# Patient Record
Sex: Female | Born: 1980 | Hispanic: Yes | Smoking: Never smoker
Health system: Southern US, Community
[De-identification: ages and names within clinical notes are randomized; demographics above are authoritative.]

## PROBLEM LIST (undated history)

## (undated) DIAGNOSIS — N83209 Unspecified ovarian cyst, unspecified side: Secondary | ICD-10-CM

## (undated) DIAGNOSIS — J45909 Unspecified asthma, uncomplicated: Secondary | ICD-10-CM

## (undated) DIAGNOSIS — F419 Anxiety disorder, unspecified: Secondary | ICD-10-CM

## (undated) DIAGNOSIS — N939 Abnormal uterine and vaginal bleeding, unspecified: Secondary | ICD-10-CM

## (undated) DIAGNOSIS — Z803 Family history of malignant neoplasm of breast: Secondary | ICD-10-CM

## (undated) DIAGNOSIS — R87619 Unspecified abnormal cytological findings in specimens from cervix uteri: Secondary | ICD-10-CM

## (undated) DIAGNOSIS — F32A Depression, unspecified: Secondary | ICD-10-CM

## (undated) DIAGNOSIS — D649 Anemia, unspecified: Secondary | ICD-10-CM

## (undated) DIAGNOSIS — Z789 Other specified health status: Secondary | ICD-10-CM

## (undated) DIAGNOSIS — G43009 Migraine without aura, not intractable, without status migrainosus: Secondary | ICD-10-CM

## (undated) DIAGNOSIS — F329 Major depressive disorder, single episode, unspecified: Secondary | ICD-10-CM

## (undated) HISTORY — DX: Family history of malignant neoplasm of breast: Z80.3

## (undated) HISTORY — PX: COLPOSCOPY: SHX161

## (undated) HISTORY — DX: Abnormal uterine and vaginal bleeding, unspecified: N93.9

## (undated) HISTORY — DX: Anemia, unspecified: D64.9

## (undated) HISTORY — DX: Depression, unspecified: F32.A

## (undated) HISTORY — DX: Anxiety disorder, unspecified: F41.9

## (undated) HISTORY — DX: Unspecified abnormal cytological findings in specimens from cervix uteri: R87.619

## (undated) HISTORY — DX: Migraine without aura, not intractable, without status migrainosus: G43.009

## (undated) HISTORY — DX: Major depressive disorder, single episode, unspecified: F32.9

## (undated) HISTORY — DX: Unspecified asthma, uncomplicated: J45.909

## (undated) HISTORY — DX: Unspecified ovarian cyst, unspecified side: N83.209

## (undated) HISTORY — PX: TUBAL LIGATION: SHX77

---

## 2017-04-20 ENCOUNTER — Encounter (HOSPITAL_COMMUNITY): Payer: Self-pay | Admitting: Emergency Medicine

## 2017-04-20 ENCOUNTER — Emergency Department (HOSPITAL_COMMUNITY)
Admission: EM | Admit: 2017-04-20 | Discharge: 2017-04-20 | Disposition: A | Discharge status: Home / Self Care | Payer: Medicaid Other | Attending: Emergency Medicine | Admitting: Emergency Medicine

## 2017-04-20 DIAGNOSIS — N939 Abnormal uterine and vaginal bleeding, unspecified: Secondary | ICD-10-CM | POA: Insufficient documentation

## 2017-04-20 LAB — HEMOGLOBIN AND HEMATOCRIT, BLOOD
HEMATOCRIT: 32.7 % — AB (ref 36.0–46.0)
HEMOGLOBIN: 9.4 g/dL — AB (ref 12.0–15.0)

## 2017-04-20 LAB — I-STAT BETA HCG BLOOD, ED (MC, WL, AP ONLY): I-stat hCG, quantitative: <5 m[IU]/mL (ref <5)

## 2017-04-20 MED ORDER — MEDROXYPROGESTERONE ACETATE 5 MG PO TABS: 10.0000 mg | ORAL_TABLET | Freq: Every day | ORAL | 0 refills | Status: DC

## 2017-04-20 NOTE — ED Provider Notes (Signed)
[facility] DEPT Provider Note   CSN: [id number] Arrival date & time: 04/20/17  1322     History   Chief Complaint Chief Complaint  Patient presents with  . Vaginal Bleeding    HPI Terri Walker is a 36 y.o. female PMHx of anemia presents today complaining of abdominal pain that started last night and vaginal bleeding that began today this afternoon. She reports her abdominal pain as unchanged since, constant, gripping pain is moderate to severe in severity. She any alleviating or aggravating factors. She denies trying anything for her pain. She also reports vaginal bleeding that has been constant, unchanged, and bright red blood. She reports going through 2 pads since this afternoon. She states this is different from her usual menstrual cycle and does not think this is part of it. She states her LMP was 2 months and 6 days ago. LMP On 02/12/2017. She denies any fever, chills, chest pain, shortness of breath, nausea, vomiting, diarrhea. She denies any vaginal discharge, vaginal pain. She denies hx of STD.   The history is provided by the patient. No language interpreter was used.  Vaginal Bleeding  Primary symptoms include vaginal bleeding. Associated symptoms include abdominal pain. Pertinent negatives include no diarrhea, no nausea and no vomiting.    History reviewed. No pertinent past medical history.  There are no active problems to display for this patient.   History reviewed. No pertinent surgical history.  OB History    No data available       Home Medications    Prior to Admission medications   Medication Sig Start Date End Date Taking? Authorizing Provider  medroxyPROGESTERone (PROVERA) 5 MG tablet Take 2 tablets (10 mg total) by mouth daily. 04/20/17 04/25/17  Bettey Costa, PA    Family History No family history on file.  Social History Social History  Substance Use Topics  . Smoking status: Not on file  . Smokeless tobacco: Not on file  .  Alcohol use No     Allergies   Patient has no allergy information on record.   Review of Systems Review of Systems  Constitutional: Negative for chills and fever.  Gastrointestinal: Positive for abdominal pain. Negative for diarrhea, nausea and vomiting.  Genitourinary: Positive for vaginal bleeding.  All other systems reviewed and are negative.    Physical Exam Updated Vital Signs BP (!) 176/85 (BP Location: Left Arm)   Pulse (!) 104   Temp 98.9 F (37.2 C) (Oral)   Resp 16   LMP 02/12/2017   SpO2 100%   Physical Exam  Constitutional: She appears well-developed and well-nourished.  Well appearing  HENT:  Head: Normocephalic and atraumatic.  Nose: Nose normal.  Eyes: Conjunctivae and EOM are normal.  Neck: Normal range of motion.  Cardiovascular: Normal rate, normal heart sounds and intact distal pulses.   No murmur heard. Pulmonary/Chest: Effort normal and breath sounds normal. No respiratory distress. She has no wheezes. She has no rales.  Normal work of breathing. No respiratory distress noted.   Abdominal: Soft. Bowel sounds are normal. There is tenderness (Lower abdomen). There is no rebound.  Genitourinary:  Genitourinary Comments: Exam performed by Bettey Costa,  exam chaperoned Date: 04/20/2017 Pelvic exam: normal external genitalia without evidence of trauma. VULVA: normal appearing vulva with no masses, tenderness or lesion. Dark blood noted. VAGINA: mild to moderate about of dark blood with small amount of bleeding pooling during exam. Small amount of blood clots noted. No lesions noted. CERVIX: normal appearing cervix  without lesions, cervical motion tenderness absent, cervical os closed with out purulent discharge; vaginal discharge - bloody,    ADNEXA: normal adnexa in size, mild tenderness to right side, no TTP to left and no masses UTERUS: uterus is normal size, shape, consistency and nontender.   Musculoskeletal: Normal range of motion.    Neurological: She is alert.  Skin: Skin is warm. Capillary refill takes less than 2 seconds.  Psychiatric: She has a normal mood and affect. Her behavior is normal.  Nursing note and vitals reviewed.    ED Treatments / Results  Labs (all labs ordered are listed, but only abnormal results are displayed) Labs Reviewed  HEMOGLOBIN AND HEMATOCRIT, BLOOD - Abnormal; Notable for the following:       Result Value   Hemoglobin 9.4 (*)    HCT 32.7 (*)    All other components within normal limits  I-STAT BETA HCG BLOOD, ED ([facility], [facility], [facility] ONLY)  SAMPLE TO BLOOD BANK    EKG  EKG Interpretation None       Radiology No results found.  Procedures Procedures (including critical care time)  Medications Ordered in ED Medications - No data to display   Initial Impression / Assessment and Plan / ED Course  I have reviewed the triage vital signs and the nursing notes.  Pertinent labs & imaging results that were available during my care of the patient were reviewed by me and considered in my medical decision making (see chart for details).    Patient here with  dysmenorrhea. On exam patient is afebrile, hemodynamically stable, in no apparent distress. Heart and lung sounds are clear. Patient had mild tenderness to lower abdomen/ suprapubic area. Pelvic exam showed minimal active bleeding, dark in nature. Pregnancy test negative. Hbg 9.4. She reports a history of anemia. I discussed pt case with Dr. Tamera Punt who was not very concerned about pt's presentation due to her not having a period in over 2 months.  Patient given instruction to follow up with [facility] for any specific new or worsening symptoms where they have an emergency department for her specialized care. Patient also given instruction to follow up outpatient with [facility] regarding today's visit to see a GYN. Patient started on Provera. Patient given instruction to start Provera in 2-3 days if  symptoms have not improved. I feel safe for discharge at this time. Patient verbally understands and is in agreement with assessment and plan. Case discussed with Dr. Tamera Punt who is in agreement with assessment and plan.     Final Clinical Impressions(s) / ED Diagnoses   Final diagnoses:  Vaginal bleeding    New Prescriptions Discharge Medication List as of 04/20/2017  8:07 PM    START taking these medications   Details  medroxyPROGESTERone (PROVERA) 5 MG tablet Take 2 tablets (10 mg total) by mouth daily., Starting Mon 04/20/2017, Until Sat 04/25/2017, Print         Leandrew Koyanagi [place], [place] 04/20/17 2350    Malvin Johns, MD 04/21/17 [phone]

## 2017-04-20 NOTE — Discharge Instructions (Signed)
Please follow up with [facility] which also has an emergency Department. Please go there for any new or worsening symptoms. You can also schedule appointment for outpatient visit to the same [place] help right away if: You pass out. You are changing pads every 15 to 30 minutes. You have abdominal pain. You have a fever. You become sweaty or weak. You are passing large blood clots from the vagina. You start to feel nauseous and vomit.

## 2017-04-20 NOTE — ED Triage Notes (Signed)
Per patient, states she hasn't had a period for over 2 months-states she stood up and started to bleed-having lower abdominal pain-states urine pregnancy test was negative-states with all her pregnancies her tests were negative

## 2017-04-21 LAB — SAMPLE TO BLOOD BANK

## 2018-04-22 ENCOUNTER — Other Ambulatory Visit: Payer: Self-pay

## 2018-04-22 ENCOUNTER — Inpatient Hospital Stay (HOSPITAL_COMMUNITY)
Admission: AD | Admit: 2018-04-22 | Discharge: 2018-04-23 | Disposition: A | Discharge status: Home / Self Care | Payer: Medicaid Other | Source: Ambulatory Visit | Attending: Obstetrics and Gynecology | Admitting: Obstetrics and Gynecology

## 2018-04-22 ENCOUNTER — Encounter (HOSPITAL_COMMUNITY): Payer: Self-pay | Admitting: *Deleted

## 2018-04-22 DIAGNOSIS — Z885 Allergy status to narcotic agent status: Secondary | ICD-10-CM | POA: Insufficient documentation

## 2018-04-22 DIAGNOSIS — N920 Excessive and frequent menstruation with regular cycle: Secondary | ICD-10-CM | POA: Insufficient documentation

## 2018-04-22 DIAGNOSIS — D500 Iron deficiency anemia secondary to blood loss (chronic): Secondary | ICD-10-CM | POA: Diagnosis not present

## 2018-04-22 DIAGNOSIS — R110 Nausea: Secondary | ICD-10-CM | POA: Insufficient documentation

## 2018-04-22 DIAGNOSIS — N939 Abnormal uterine and vaginal bleeding, unspecified: Secondary | ICD-10-CM

## 2018-04-22 HISTORY — DX: Other specified health status: Z78.9

## 2018-04-22 LAB — URINALYSIS, ROUTINE W REFLEX MICROSCOPIC
Bilirubin Urine: NEGATIVE
GLUCOSE, UA: NEGATIVE mg/dL
Ketones, ur: NEGATIVE mg/dL
Leukocytes, UA: NEGATIVE
Nitrite: NEGATIVE
PROTEIN: 30 mg/dL — AB
RBC / HPF: >50 RBC/hpf — ABNORMAL HIGH (ref 0–5)
Specific Gravity, Urine: 1.021 (ref 1.005–1.030)
pH: 6 (ref 5.0–8.0)

## 2018-04-22 LAB — CBC
HCT: 26.6 % — ABNORMAL LOW (ref 36.0–46.0)
Hemoglobin: 7.3 g/dL — ABNORMAL LOW (ref 12.0–15.0)
MCH: 17.5 pg — ABNORMAL LOW (ref 26.0–34.0)
MCHC: 27.4 g/dL — ABNORMAL LOW (ref 30.0–36.0)
MCV: 63.6 fL — ABNORMAL LOW (ref 78.0–100.0)
Platelets: 369 10*3/uL (ref 150–400)
RBC: 4.18 MIL/uL (ref 3.87–5.11)
RDW: 20.7 % — ABNORMAL HIGH (ref 11.5–15.5)
WBC: 9.3 10*3/uL (ref 4.0–10.5)

## 2018-04-22 LAB — HCG, SERUM, QUALITATIVE: PREG SERUM: NEGATIVE

## 2018-04-22 LAB — POCT PREGNANCY, URINE: PREG TEST UR: NEGATIVE

## 2018-04-22 NOTE — MAU Provider Note (Signed)
Chief Complaint:  Nausea and Vaginal Bleeding   First Provider Initiated Contact with Patient 04/22/18 2341      HPI: Terri Walker is a 37 y.o. [id number] who presents to maternity admissions reporting heavy vaginal bleeding since May 1st.  Also has cramping, mostly on left side. Has had dizziness but not much now. . She reports vaginal bleeding, vaginal itching/burning, urinary symptoms, h/a, dizziness, n/v, or fever/chills.    Vaginal Bleeding  The patient's primary symptoms include pelvic pain and vaginal bleeding. The patient's pertinent negatives include no genital itching, genital lesions or genital odor. This is a recurrent problem. The current episode started 1 to 4 weeks ago. The problem occurs constantly. The problem has been unchanged. She is not pregnant. Associated symptoms include abdominal pain. Pertinent negatives include no constipation, diarrhea, dysuria, fever, frequency or headaches. The vaginal discharge was bloody. The vaginal bleeding is heavier than menses. She has been passing clots. She has not been passing tissue. Nothing aggravates the symptoms. She has tried nothing for the symptoms. Her menstrual history has been regular (except for this cycle).   RN note: Pt presents to MAU c/o vaginal bleeding and clotting. Pt states on March 22nd was her last normal LMP and she states no period in the month of April. Pt reports that on May 1st she started to have heavy vaginal bleeding and abdominal cramping. Pt states she she is passing clots the size of a small half dollar size coin to a small tangerine. Pt reports feeling dizzy. Pt reports the pain is on her left side of her abdomen and to her back.     Past Medical History: Past Medical History:  Diagnosis Date  . Medical history non-contributory     Past obstetric history: OB History  Gravida Para Term Preterm AB Living  5 4 4   1 4   SAB TAB Ectopic Multiple Live Births  1       4    # Outcome Date GA Lbr Len/2nd  Weight Sex Delivery Anes PTL Lv  5 SAB           4 Term           3 Term           2 Term           1 Term             Past Surgical History: Past Surgical History:  Procedure Laterality Date  . CESAREAN SECTION    . CESAREAN SECTION WITH BILATERAL TUBAL LIGATION      Family History: Family History  Problem Relation Age of Onset  . Cancer Mother   . Diabetes Father   . ADD / ADHD Neg Hx   . Alcohol abuse Neg Hx   . Anxiety disorder Neg Hx   . Arthritis Neg Hx   . Asthma Neg Hx   . Birth defects Neg Hx   . COPD Neg Hx   . Depression Neg Hx   . Drug abuse Neg Hx   . Early death Neg Hx   . Hearing loss Neg Hx   . Heart disease Neg Hx   . Hypertension Neg Hx   . Hyperlipidemia Neg Hx   . Intellectual disability Neg Hx   . Learning disabilities Neg Hx   . Kidney disease Neg Hx   . Miscarriages / Stillbirths Neg Hx   . Obesity Neg Hx   . Stroke Neg Hx   . Vision loss  Neg Hx   . Varicose Veins Neg Hx     Social History: Social History   Tobacco Use  . Smoking status: Never Smoker  . Smokeless tobacco: Never Used  Substance Use Topics  . Alcohol use: No  . Drug use: No    Allergies:  Allergies  Allergen Reactions  . Codeine Shortness Of Breath    Meds:  Medications Prior to Admission  Medication Sig Dispense Refill Last Dose  . medroxyPROGESTERone (PROVERA) 5 MG tablet Take 2 tablets (10 mg total) by mouth daily. 10 tablet 0     I have reviewed patient's Past Medical Hx, Surgical Hx, Family Hx, Social Hx, medications and allergies.  ROS:  Review of Systems  Constitutional: Negative for fever.  Gastrointestinal: Positive for abdominal pain. Negative for constipation and diarrhea.  Genitourinary: Positive for pelvic pain and vaginal bleeding. Negative for dysuria and frequency.  Neurological: Negative for headaches.   Other systems negative     Physical Exam   Patient Vitals for the past 24 hrs:  BP Temp Temp src Resp Height Weight  04/22/18  1938 (!) 161/92 98.8 F (37.1 C) Oral 17 5\' 5"  (1.651 m) (!) 306 lb 1.9 oz (138.9 kg)   Constitutional: Well-developed, well-nourished female in no acute distress.  Cardiovascular: normal rate and rhythm, no ectopy audible, S1 & S2 heard, no murmur Respiratory: normal effort, no distress. Lungs CTAB with no wheezes or crackles GI: Abd soft, non-tender.  Nondistended.  No rebound, No guarding.  Bowel Sounds audible  MS: Extremities nontender, no edema, normal ROM Neurologic: Alert and oriented x 4.   Grossly nonfocal. GU: Neg CVAT. Skin:  Warm and Dry Psych:  Affect appropriate.  PELVIC EXAM: Cervix pink, visually closed, without lesion, small  Amount of bloody discharge, vaginal walls and external genitalia normal Bimanual exam: Cervix firm, anterior, neg CMT, uterus nontender, nonenlarged, adnexa without tenderness, enlargement, or mass    Labs: Results for orders placed or performed during the hospital encounter of 04/22/18 (from the past 24 hour(s))  Urinalysis, Routine w reflex microscopic     Status: Abnormal   Collection Time: 04/22/18  7:37 PM  Result Value Ref Range   Color, Urine YELLOW YELLOW   APPearance HAZY (A) CLEAR   Specific Gravity, Urine 1.021 1.005 - 1.030   pH 6.0 5.0 - 8.0   Glucose, UA NEGATIVE NEGATIVE mg/dL   Hgb urine dipstick LARGE (A) NEGATIVE   Bilirubin Urine NEGATIVE NEGATIVE   Ketones, ur NEGATIVE NEGATIVE mg/dL   Protein, ur 30 (A) NEGATIVE mg/dL   Nitrite NEGATIVE NEGATIVE   Leukocytes, UA NEGATIVE NEGATIVE   RBC / HPF >50 (H) 0 - 5 RBC/hpf   WBC, UA 11-20 0 - 5 WBC/hpf   Bacteria, UA RARE (A) NONE SEEN   Squamous Epithelial / LPF 6-10 0 - 5   Mucus PRESENT   Pregnancy, urine POC     Status: None   Collection Time: 04/22/18  8:17 PM  Result Value Ref Range   Preg Test, Ur NEGATIVE NEGATIVE  CBC     Status: Abnormal   Collection Time: 04/22/18 10:06 PM  Result Value Ref Range   WBC 9.3 4.0 - 10.5 K/uL   RBC 4.18 3.87 - 5.11 MIL/uL    Hemoglobin 7.3 (L) 12.0 - 15.0 g/dL   HCT 26.6 (L) 36.0 - 46.0 %   MCV 63.6 (L) 78.0 - 100.0 fL   MCH 17.5 (L) 26.0 - 34.0 pg   MCHC 27.4 (L)  30.0 - 36.0 g/dL   RDW 20.7 (H) 11.5 - 15.5 %   Platelets 369 150 - 400 K/uL  hCG, serum, qualitative     Status: None   Collection Time: 04/22/18 10:06 PM  Result Value Ref Range   Preg, Serum NEGATIVE NEGATIVE       Ref. Range 04/20/2017 15:07  Hemoglobin Latest Ref Range: 12.0 - 15.0 g/dL 9.4 (L)    Imaging:  No results found.  MAU Course/MDM: I have ordered labs as follows:  See above. Pt insists UPTs are not accurate for her so qualitative done Imaging ordered: Outpatient [place] Results reviewed. Has anemia but is hemodynamically stable  Consult Dr Elly Modena.  Recommends Fereheme and Megace for discharge   Will follow in clinic.   Treatments in MAU included Fereheme.  States feels better after infusion Pt stable at time of discharge.  Assessment: Abnormal Uterine Bleeding Anemia Due to Blood Loss  Plan: Discharge home Recommend followup in office (will have office schedule) Rx sent for megace for menorrhagia Outpatient [place] ordered Bleeding precautions  Encouraged to return here or to other Urgent Care/ED if she develops worsening of symptoms, increase in pain, fever, or other concerning symptoms.   Hansel Feinstein CNM, MSN Certified Nurse-Midwife 04/22/2018 11:42 PM

## 2018-04-22 NOTE — MAU Note (Signed)
Pt presents to MAU c/o vaginal bleeding and clotting. Pt states on March 22nd was her last normal LMP and she states no period in the month of April. Pt reports that on May 1st she started to have heavy vaginal bleeding and abdominal cramping. Pt states she she is passing clots the size of a small half dollar size coin to a small tangerine. Pt reports feeling dizzy. Pt reports the pain is on her left side of her abdomen and to her back.

## 2018-04-23 DIAGNOSIS — N939 Abnormal uterine and vaginal bleeding, unspecified: Secondary | ICD-10-CM

## 2018-04-23 MED ORDER — FERUMOXYTOL INJECTION 510 MG/17 ML
510.0000 mg | Freq: Once | INTRAVENOUS | Status: AC
  Administered 2018-04-23: 510 mg via INTRAVENOUS
  Filled 2018-04-23: qty 17

## 2018-04-23 MED ORDER — SODIUM CHLORIDE 0.9 % IV SOLN
INTRAVENOUS | Status: DC
  Administered 2018-04-23: 01:00:00 via INTRAVENOUS

## 2018-04-23 MED ORDER — MEGESTROL ACETATE 40 MG PO TABS: 40.0000 mg | ORAL_TABLET | Freq: Two times a day (BID) | ORAL | 5 refills | Status: DC

## 2018-04-23 MED ORDER — FERROUS FUMARATE-FOLIC ACID 324-1 MG PO TABS: 1.0000 | ORAL_TABLET | Freq: Two times a day (BID) | ORAL | 1 refills | Status: DC

## 2018-04-23 NOTE — Discharge Instructions (Signed)
° °Abnormal Uterine Bleeding °Abnormal uterine bleeding can affect women at various stages in life, including teenagers, women in their reproductive years, pregnant women, and women who have reached menopause. Several kinds of uterine bleeding are considered abnormal, including: °· Bleeding or spotting between periods. °· Bleeding after sexual intercourse. °· Bleeding that is heavier or more than normal. °· Periods that last longer than usual. °· Bleeding after menopause. ° °Many cases of abnormal uterine bleeding are minor and simple to treat, while others are more serious. Any type of abnormal bleeding should be evaluated by your health care provider. Treatment will depend on the cause of the bleeding. °Follow these instructions at home: °Monitor your condition for any changes. The following actions may help to alleviate any discomfort you are experiencing: °· Avoid the use of tampons and douches as directed by your health care provider. °· Change your pads frequently. ° °You should get regular pelvic exams and Pap tests. Keep all follow-up appointments for diagnostic tests as directed by your health care provider. °Contact a health care provider if: °· Your bleeding lasts more than 1 week. °· You feel dizzy at times. °Get help right away if: °· You pass out. °· You are changing pads every 15 to 30 minutes. °· You have abdominal pain. °· You have a fever. °· You become sweaty or weak. °· You are passing large blood clots from the vagina. °· You start to feel nauseous and vomit. °This information is not intended to replace advice given to you by your health care provider. Make sure you discuss any questions you have with your health care provider. °Document Released: 11/24/2005 Document Revised: 05/07/2016 Document Reviewed: 06/23/2013 °Elsevier Interactive Patient Education © 2017 [employer] ° ° °Anemia °Anemia is a condition in which you do not have enough red blood cells or hemoglobin. Hemoglobin is a  substance in red blood cells that carries oxygen. When you do not have enough red blood cells or hemoglobin (are anemic), your body cannot get enough oxygen and your organs may not work properly. As a result, you may feel very tired or have other problems. °What are the causes? °Common causes of anemia include: °· Excessive bleeding. Anemia can be caused by excessive bleeding inside or outside the body, including bleeding from the intestine or from periods in women. °· Poor nutrition. °· Long-lasting (chronic) kidney, thyroid, and liver disease. °· Bone marrow disorders. °· Cancer and treatments for cancer. °· HIV (human immunodeficiency virus) and AIDS (acquired immunodeficiency syndrome). °· Treatments for HIV and AIDS. °· Spleen problems. °· Blood disorders. °· Infections, medicines, and autoimmune disorders that destroy red blood cells. ° °What are the signs or symptoms? °Symptoms of this condition include: °· Minor weakness. °· Dizziness. °· Headache. °· Feeling heartbeats that are irregular or faster than normal (palpitations). °· Shortness of breath, especially with exercise. °· Paleness. °· Cold sensitivity. °· Indigestion. °· Nausea. °· Difficulty sleeping. °· Difficulty concentrating. ° °Symptoms may occur suddenly or develop slowly. If your anemia is mild, you may not have symptoms. °How is this diagnosed? °This condition is diagnosed based on: °· Blood tests. °· Your medical history. °· A physical exam. °· Bone marrow biopsy. ° °Your health care provider may also check your stool (feces) for blood and may do additional testing to look for the cause of your bleeding. °You may also have other tests, including: °· Imaging tests, such as a CT scan or MRI. °· Endoscopy. °· Colonoscopy. ° °How is this treated? °  this condition depends on the cause. If you continue to lose a lot of blood, you may need to be treated at a hospital. Treatment may include:  Taking supplements of iron, vitamin U27, or folic  acid.  Taking a hormone medicine (erythropoietin) that can help to stimulate red blood cell growth.  Having a blood transfusion. This may be needed if you lose a lot of blood.  Making changes to your diet.  Having surgery to remove your spleen.  Follow these instructions at home:  Take over-the-counter and prescription medicines only as told by your health care provider.  Take supplements only as told by your health care provider.  Follow any diet instructions that you were given.  Keep all follow-up visits as told by your health care provider. This is important. Contact a health care provider if:  You develop new bleeding anywhere in the body. Get help right away if:  You are very weak.  You are short of breath.  You have pain in your abdomen or chest.  You are dizzy or feel faint.  You have trouble concentrating.  You have bloody or black, tarry stools.  You vomit repeatedly or you vomit up blood. Summary  Anemia is a condition in which you do not have enough red blood cells or enough of a substance in your red blood cells that carries oxygen (hemoglobin).  Symptoms may occur suddenly or develop slowly.  If your anemia is mild, you may not have symptoms.  This condition is diagnosed with blood tests as well as a medical history and physical exam. Other tests may be needed.  Treatment for this condition depends on the cause of the anemia. This information is not intended to replace advice given to you by your health care provider. Make sure you discuss any questions you have with your health care provider. Document Released: 01/01/2005 Document Revised: 12/26/2016 Document Reviewed: 12/26/2016 Elsevier Interactive Patient Education  [employer].

## 2018-04-29 ENCOUNTER — Ambulatory Visit (HOSPITAL_COMMUNITY): Payer: Medicaid Other

## 2018-04-30 ENCOUNTER — Ambulatory Visit (HOSPITAL_COMMUNITY): Payer: Medicaid Other | Attending: Advanced Practice Midwife

## 2018-05-21 ENCOUNTER — Encounter: Payer: Medicaid Other | Admitting: Obstetrics & Gynecology

## 2018-06-29 ENCOUNTER — Inpatient Hospital Stay (HOSPITAL_COMMUNITY)
Admission: AD | Admit: 2018-06-29 | Discharge: 2018-06-29 | Disposition: A | Discharge status: Home / Self Care | Payer: 59 | Source: Ambulatory Visit | Attending: Obstetrics and Gynecology | Admitting: Obstetrics and Gynecology

## 2018-06-29 ENCOUNTER — Encounter (HOSPITAL_COMMUNITY): Payer: Self-pay | Admitting: *Deleted

## 2018-06-29 ENCOUNTER — Other Ambulatory Visit: Payer: Self-pay

## 2018-06-29 DIAGNOSIS — R1032 Left lower quadrant pain: Secondary | ICD-10-CM | POA: Insufficient documentation

## 2018-06-29 DIAGNOSIS — Z9851 Tubal ligation status: Secondary | ICD-10-CM

## 2018-06-29 DIAGNOSIS — N921 Excessive and frequent menstruation with irregular cycle: Secondary | ICD-10-CM

## 2018-06-29 DIAGNOSIS — D649 Anemia, unspecified: Secondary | ICD-10-CM

## 2018-06-29 DIAGNOSIS — D508 Other iron deficiency anemias: Secondary | ICD-10-CM

## 2018-06-29 DIAGNOSIS — N939 Abnormal uterine and vaginal bleeding, unspecified: Secondary | ICD-10-CM | POA: Insufficient documentation

## 2018-06-29 LAB — URINALYSIS, ROUTINE W REFLEX MICROSCOPIC
BILIRUBIN URINE: NEGATIVE
GLUCOSE, UA: NEGATIVE mg/dL
KETONES UR: NEGATIVE mg/dL
Leukocytes, UA: NEGATIVE
NITRITE: NEGATIVE
Protein, ur: NEGATIVE mg/dL
Specific Gravity, Urine: 1.024 (ref 1.005–1.030)
pH: 6 (ref 5.0–8.0)

## 2018-06-29 LAB — WET PREP, GENITAL
Clue Cells Wet Prep HPF POC: NONE SEEN
Sperm: NONE SEEN
Trich, Wet Prep: NONE SEEN
Yeast Wet Prep HPF POC: NONE SEEN

## 2018-06-29 LAB — CBC
HCT: 32.1 % — ABNORMAL LOW (ref 36.0–46.0)
HEMOGLOBIN: 9.8 g/dL — AB (ref 12.0–15.0)
MCH: 22.2 pg — ABNORMAL LOW (ref 26.0–34.0)
MCHC: 30.5 g/dL (ref 30.0–36.0)
MCV: 72.6 fL — ABNORMAL LOW (ref 78.0–100.0)
Platelets: 299 10*3/uL (ref 150–400)
RBC: 4.42 MIL/uL (ref 3.87–5.11)
RDW: 24.3 % — ABNORMAL HIGH (ref 11.5–15.5)
WBC: 9.2 10*3/uL (ref 4.0–10.5)

## 2018-06-29 LAB — POCT PREGNANCY, URINE: Preg Test, Ur: NEGATIVE

## 2018-06-29 LAB — HCG, SERUM, QUALITATIVE: Preg, Serum: NEGATIVE

## 2018-06-29 MED ORDER — MEDROXYPROGESTERONE ACETATE 5 MG PO TABS: ORAL_TABLET | ORAL | 0 refills | Status: DC

## 2018-06-29 MED ORDER — ACETAMINOPHEN 325 MG PO TABS
650.0000 mg | ORAL_TABLET | Freq: Once | ORAL | Status: AC
  Administered 2018-06-29: 650 mg via ORAL
  Filled 2018-06-29: qty 2

## 2018-06-29 MED ORDER — NORGESTIMATE-ETH ESTRADIOL 0.25-35 MG-MCG PO TABS: 1.0000 | ORAL_TABLET | Freq: Every day | ORAL | 11 refills | Status: DC

## 2018-06-29 NOTE — MAU Provider Note (Signed)
History     CSN: [id number]  Arrival date and time: 06/29/18 4098   First Provider Initiated Contact with Patient 06/29/18 1002      Chief Complaint  Patient presents with  . Vaginal Bleeding   HPI  Terri Walker is a 37 y.o. [id number] NON-pregnant patient who presents to MAU with chief complaint of heavy vaginal bleeding and LLQ pain  Heavy vaginal bleeding This is an existing problem. States she had a period in April 2019, heavy bleeding in May, two weeks without bleeding after being treated in MAU, but has been saturating 3-4 pads per day since June 20.   LLQ Pain This is an existing problem. Patient reports "big pain" in LLQ. Unable to rate, "stabbing", does not radiate, no aggravating or alleviating factors. Did not attend outpatient ultrasound as ordered during May 2019 MAU visit. Reports she does not have cell phone service at work but does not know if she was ever called for ultrasound appointment.  Headache This is a new problem. Patient reports new onset headache without visual disturbances. Onset is recent, patient unable to specify date/circumstances. Pain is frontal "like an arrow through my forehead", 6-10/10. No aggravating or alleviating factors. Has not tried to treat with medicine.  Patient states she has not been taking her prescribed iron supplement. Reports intermittent lightheadedness, denies shortness of breath.  Patient has not established care for outpatient treatment.  Pertinent Gynecological History: Menses: Irregular, moderate to heavy bleeding Bleeding: dysfunctional uterine bleeding Contraception: tubal ligation DES exposure: unknown Blood transfusions: none Sexually transmitted diseases: no past history Previous GYN Procedures: N/A  Last mammogram: N/A age 31  Last pap: normal Date: unknown, states within past five years   Past Medical History:  Diagnosis Date  . Medical history non-contributory     Past Surgical History:  Procedure  Laterality Date  . CESAREAN SECTION    . CESAREAN SECTION WITH BILATERAL TUBAL LIGATION      Family History  Problem Relation Age of Onset  . Cancer Mother   . Diabetes Father   . ADD / ADHD Neg Hx   . Alcohol abuse Neg Hx   . Anxiety disorder Neg Hx   . Arthritis Neg Hx   . Asthma Neg Hx   . Birth defects Neg Hx   . COPD Neg Hx   . Depression Neg Hx   . Drug abuse Neg Hx   . Early death Neg Hx   . Hearing loss Neg Hx   . Heart disease Neg Hx   . Hypertension Neg Hx   . Hyperlipidemia Neg Hx   . Intellectual disability Neg Hx   . Learning disabilities Neg Hx   . Kidney disease Neg Hx   . Miscarriages / Stillbirths Neg Hx   . Obesity Neg Hx   . Stroke Neg Hx   . Vision loss Neg Hx   . Varicose Veins Neg Hx     Social History   Tobacco Use  . Smoking status: Never Smoker  . Smokeless tobacco: Never Used  Substance Use Topics  . Alcohol use: No  . Drug use: No    Allergies:  Allergies  Allergen Reactions  . Codeine Shortness Of Breath    Medications Prior to Admission  Medication Sig Dispense Refill Last Dose  . Ferrous Fumarate-Folic Acid (HEMOCYTE-F) 324-1 MG TABS Take 1 tablet by mouth every 12 (twelve) hours. 60 each 1   . megestrol (MEGACE) 40 MG tablet Take 1 tablet (40 mg total)  by mouth 2 (two) times daily. 60 tablet 5     Review of Systems  Constitutional: Negative for chills, fatigue and fever.  Respiratory: Negative for choking, chest tightness, shortness of breath and wheezing.   Cardiovascular: Negative for leg swelling.  Gastrointestinal:       LLQ pain  Genitourinary: Positive for vaginal bleeding. Negative for vaginal pain.  Neurological: Positive for light-headedness and headaches.  All other systems reviewed and are negative.  Physical Exam   Blood pressure (!) 157/95, pulse 87, temperature 98.5 F (36.9 C), temperature source Oral, resp. rate 18, weight 294 lb 12 oz (133.7 kg), last menstrual period 05/28/2018, SpO2 99 %.  Physical  Exam  Nursing note and vitals reviewed. Constitutional: She is oriented to person, place, and time. She appears well-developed and well-nourished.  Cardiovascular: Normal rate, regular rhythm, normal heart sounds and intact distal pulses.  GI: She exhibits no mass. There is tenderness. There is no rebound and no guarding.  LLQ tenderness to deep palpation  Genitourinary: Vagina normal and uterus normal.  Genitourinary Comments: Dark red blood visible on spec exam  Neurological: She is alert and oriented to person, place, and time. She has normal reflexes.  Skin: Skin is warm and dry.  Psychiatric: She has a normal mood and affect. Her behavior is normal. Judgment and thought content normal.    MAU Course  Procedures  MDM BMI 57, age 46, likely anovulatory Preexisting anemia, asymptomatic today Not consistent with recommended iron supplementation States Provera prescribed in 2018 was more effective than Megace prescribed in 2019 S/p BTL, not currently moderating bleeding with any medication other than Megace Headache alleviated by Tylenol given in MAU Patient extremely active and lively in MAU. Confirms health complaints are not impacting her lifestyle or level of activity  Patient Vitals for the past 24 hrs:  BP Temp Temp src Pulse Resp SpO2 Weight  06/29/18 1149 (!) 139/95 - - 85 - - -  06/29/18 0948 (!) 157/95 98.5 F (36.9 C) Oral 87 18 99 % 294 lb 12 oz (133.7 kg)    Orders Placed This Encounter  Procedures  . Wet prep, genital  . hCG, serum, qualitative  . CBC  . Urinalysis, Routine w reflex microscopic  . Pregnancy, urine POC  . Discharge patient   Results for orders placed or performed during the hospital encounter of 06/29/18 (from the past 24 hour(s))  hCG, serum, qualitative     Status: None   Collection Time: 06/29/18 10:11 AM  Result Value Ref Range   Preg, Serum NEGATIVE NEGATIVE  CBC     Status: Abnormal   Collection Time: 06/29/18 10:11 AM  Result Value  Ref Range   WBC 9.2 4.0 - 10.5 K/uL   RBC 4.42 3.87 - 5.11 MIL/uL   Hemoglobin 9.8 (L) 12.0 - 15.0 g/dL   HCT 32.1 (L) 36.0 - 46.0 %   MCV 72.6 (L) 78.0 - 100.0 fL   MCH 22.2 (L) 26.0 - 34.0 pg   MCHC 30.5 30.0 - 36.0 g/dL   RDW 24.3 (H) 11.5 - 15.5 %   Platelets 299 150 - 400 K/uL  Wet prep, genital     Status: Abnormal   Collection Time: 06/29/18 10:29 AM  Result Value Ref Range   Yeast Wet Prep HPF POC NONE SEEN NONE SEEN   Trich, Wet Prep NONE SEEN NONE SEEN   Clue Cells Wet Prep HPF POC NONE SEEN NONE SEEN   WBC, Wet Prep HPF POC FEW (  A) NONE SEEN   Sperm NONE SEEN   Urinalysis, Routine w reflex microscopic     Status: Abnormal   Collection Time: 06/29/18 11:13 AM  Result Value Ref Range   Color, Urine YELLOW YELLOW   APPearance HAZY (A) CLEAR   Specific Gravity, Urine 1.024 1.005 - 1.030   pH 6.0 5.0 - 8.0   Glucose, UA NEGATIVE NEGATIVE mg/dL   Hgb urine dipstick LARGE (A) NEGATIVE   Bilirubin Urine NEGATIVE NEGATIVE   Ketones, ur NEGATIVE NEGATIVE mg/dL   Protein, ur NEGATIVE NEGATIVE mg/dL   Nitrite NEGATIVE NEGATIVE   Leukocytes, UA NEGATIVE NEGATIVE   RBC / HPF 21-50 0 - 5 RBC/hpf   WBC, UA 6-10 0 - 5 WBC/hpf   Bacteria, UA RARE (A) NONE SEEN   Squamous Epithelial / LPF 6-10 0 - 5   Mucus PRESENT   Pregnancy, urine POC     Status: None   Collection Time: 06/29/18 11:16 AM  Result Value Ref Range   Preg Test, Ur NEGATIVE NEGATIVE   Meds ordered this encounter  Medications  . acetaminophen (TYLENOL) tablet 650 mg  . medroxyPROGESTERone (PROVERA) 5 MG tablet    Sig: Take 2 by mouth every day    Dispense:  30 tablet    Refill:  0    Order Specific Question:   Supervising Provider    Answer:   Donnamae Jude [[id number]]  . norgestimate-ethinyl estradiol (ORTHO-CYCLEN,SPRINTEC,PREVIFEM) 0.25-35 MG-MCG tablet    Sig: Take 1 tablet by mouth daily.    Dispense:  1 Package    Refill:  11    Order Specific Question:   Supervising Provider    Answer:   Donnamae Jude  [[id number]]   Assessment and Plan  --Irregular bleeding between periods --Discontinue Megace and begin short course of Provera while establishing GYN care --Patient declines outpatient pelvic ultrasound for LLQ pain  --Start COC to regulate bleeding, ACHES protocol reviewed --Restart iron supplementation as prescribed --General bleeding precautions reviewed --Discharge home in stable condition   Darlina Rumpf, CNM 06/29/2018, 11:58 AM

## 2018-06-29 NOTE — Discharge Instructions (Signed)

## 2018-06-29 NOTE — MAU Note (Signed)
Prolonged bleeding, bleeding started 32 days ago.  Got heavy last couple days, bleeding through pads and clothes, changing at least 3 an hour.

## 2018-06-30 LAB — GC/CHLAMYDIA PROBE AMP (~~LOC~~) NOT AT ARMC
Chlamydia: NEGATIVE
Neisseria Gonorrhea: NEGATIVE

## 2018-09-03 ENCOUNTER — Encounter: Payer: Self-pay | Admitting: Certified Nurse Midwife

## 2018-09-03 ENCOUNTER — Other Ambulatory Visit (HOSPITAL_COMMUNITY)
Admission: RE | Admit: 2018-09-03 | Discharge: 2018-09-03 | Disposition: A | Discharge status: Home / Self Care | Payer: 59 | Source: Ambulatory Visit | Attending: Obstetrics & Gynecology | Admitting: Obstetrics & Gynecology

## 2018-09-03 ENCOUNTER — Ambulatory Visit: Payer: 59 | Admitting: Certified Nurse Midwife

## 2018-09-03 VITALS — BP 128/82 | HR 70 | Resp 16 | Ht 64.25 in | Wt 304.0 lb

## 2018-09-03 DIAGNOSIS — N6320 Unspecified lump in the left breast, unspecified quadrant: Secondary | ICD-10-CM

## 2018-09-03 DIAGNOSIS — B9689 Other specified bacterial agents as the cause of diseases classified elsewhere: Secondary | ICD-10-CM | POA: Insufficient documentation

## 2018-09-03 DIAGNOSIS — Z113 Encounter for screening for infections with a predominantly sexual mode of transmission: Secondary | ICD-10-CM | POA: Insufficient documentation

## 2018-09-03 DIAGNOSIS — Z8742 Personal history of other diseases of the female genital tract: Secondary | ICD-10-CM

## 2018-09-03 DIAGNOSIS — Z01411 Encounter for gynecological examination (general) (routine) with abnormal findings: Secondary | ICD-10-CM

## 2018-09-03 DIAGNOSIS — N760 Acute vaginitis: Secondary | ICD-10-CM | POA: Diagnosis not present

## 2018-09-03 DIAGNOSIS — N912 Amenorrhea, unspecified: Secondary | ICD-10-CM

## 2018-09-03 DIAGNOSIS — E663 Overweight: Secondary | ICD-10-CM

## 2018-09-03 DIAGNOSIS — Z124 Encounter for screening for malignant neoplasm of cervix: Secondary | ICD-10-CM | POA: Insufficient documentation

## 2018-09-03 DIAGNOSIS — Z862 Personal history of diseases of the blood and blood-forming organs and certain disorders involving the immune mechanism: Secondary | ICD-10-CM | POA: Diagnosis not present

## 2018-09-03 DIAGNOSIS — Z8659 Personal history of other mental and behavioral disorders: Secondary | ICD-10-CM

## 2018-09-03 NOTE — Progress Notes (Signed)
37 y.o. [id number] Married  Hispanic Fe here to establish gyn care and for annual exam. Spouse with patient for support and patient agreeable to attendance and conversation.. Had last period 5/19 and bled per patient 50 days off and on heavy. Was seen in ER 06/29/18. for anemia and given iron infusion x 2 for fatigue and anemia. Surgery x 2  With C/section. History of abnormal pap smear with colposcopy. No pap follow since 2006. History migraines in past treats with Excedrin and coffee. Contraception BTL. Patient agreeable to labs and would like STD screening. Patient and spouse are amiable but not living together. Complaining of breast tenderness at times, but different. Family history of breast cancer mother age 54. Has not had mammogram screening yet. Continues with low pelvic pain on both sides off and on. Never severe. Previously on POP for cycle control at some point, but stopped. Tired of the heavy cycle occurrences. No other concerns today.         Sexually active: Yes.    The current method of family planning is tubal ligation.    Exercising: No.  exercise Smoker:  no  Review of Systems  Constitutional: Negative.   HENT: Negative.   Eyes: Negative.   Respiratory: Negative.   Cardiovascular: Negative.   Gastrointestinal: Negative.   Genitourinary:       Abnormal menstrual cycle  Musculoskeletal: Negative.   Skin:       Breast tenderness  Neurological: Negative.   Endo/Heme/Allergies: Negative.   Psychiatric/Behavioral: Negative.     Health Maintenance: Pap:  2006 History of Abnormal Pap: yes MMG:  none Self Breast exams: yes Colonoscopy:  none BMD:   none TDaP:  2014 Shingles: no Pneumonia: had done Hep C and HIV: both neg per patient Labs: if needed   reports that she has never smoked. She has never used smokeless tobacco. She reports that she does not drink alcohol or use drugs.  Past Medical History:  Diagnosis Date  . Abnormal Pap smear of cervix   . Abnormal  uterine bleeding   . Anemia   . Anxiety   . Asthma    in the past  . Depression   . Medical history non-contributory   . Migraines   . Ovarian cyst     Past Surgical History:  Procedure Laterality Date  . CESAREAN SECTION     times 2  . CESAREAN SECTION WITH BILATERAL TUBAL LIGATION    . COLPOSCOPY    . TUBAL LIGATION      Current Outpatient Medications  Medication Sig Dispense Refill  . EVENING PRIMROSE OIL PO Take by mouth.    . Ginger, Zingiber officinalis, (GINGER ROOT PO) Take by mouth.    . Ginkgo Biloba 40 MG TABS Take by mouth.    . Prenatal Vit-Fe Fumarate-FA (PRENATAL VITAMIN PO) Take by mouth.     No current facility-administered medications for this visit.     Family History  Problem Relation Age of Onset  . Breast cancer Mother   . Hypertension Mother   . Diabetes Father   . Hypertension Father   . Stroke Father   . Heart attack Father   . Miscarriages / Stillbirths Neg Hx   . Obesity Neg Hx   . Vision loss Neg Hx   . Varicose Veins Neg Hx     ROS:  Pertinent items are noted in HPI.  Otherwise, a comprehensive ROS was negative.  Exam:   BP 128/82   Pulse 70  Resp 16   Ht 5' 4.25" (1.632 m)   Wt (!) 304 lb (137.9 kg)   BMI 51.78 kg/m  Height: 5' 4.25" (163.2 cm) Ht Readings from Last 3 Encounters:  09/03/18 5' 4.25" (1.632 m)  04/22/18 5\' 5"  (0.626 m)    General appearance: alert, cooperative and appears stated age Head: Normocephalic, without obvious abnormality, atraumatic Neck: no adenopathy, supple, symmetrical, trachea midline and thyroid normal to inspection and palpation Lungs: clear to auscultation bilaterally Breasts: normal appearance, no masses or tenderness, No nipple retraction or dimpling, No nipple discharge or bleeding, No axillary or supraclavicular adenopathy, left breast mass outer quadrant  from 1-3 tender ? cyst, mobile Heart: regular rate and rhythm Abdomen: soft, non-tender; no masses,  no organomegaly Extremities:  extremities normal, atraumatic, no cyanosis or edema Skin: Skin color, texture, turgor normal. No rashes or lesions Lymph nodes: Cervical, supraclavicular, and axillary nodes normal. No abnormal inguinal nodes palpated Neurologic: Grossly normal   Pelvic: External genitalia:  no lesions, normal female              Urethra:  normal appearing urethra with no masses, tenderness or lesions              Bartholin's and Skene's: normal                 Vagina: normal appearing vagina with normal color and discharge, no lesions              Cervix: multiparous appearance, no bleeding following Pap, no cervical motion tenderness and no lesions              Pap taken: Yes.   Bimanual Exam:  Uterus:  normal size, contour, position, consistency, mobility, non-tender, anteverted and limited by body habitus              Adnexa: normal adnexa, no mass, fullness, tenderness and limited body habitus               Rectovaginal: Confirms               Anus:  normal sphincter tone, no lesions  Chaperone present: yes  A:  Well Woman with normal exam  Contraception BTL  History of irregular cycles with amenorrhea and heavy prolonged cycle with anemia in past  Left breast mass and breast tenderness  History of abnormal pap with colpo no pap follow up  STD screening  Family history of breast cancer mother age 73  Screening labs if needed.  Obesity  P:   Reviewed health and wellness pertinent to exam  Discussed amenorrhea with heavy cycles can occur if thyroid or pituitary issues occur or excessive weight also. Discussed cycle control with POP, ablation ? Possibility or surgery if continues with anemia outcome. Discussed lab screening to assess.  Lab: FSH,TSH, Prolactin, CBC TIBC  Discussed left breast finding and need to evaluate and also need to start mammogram screening at 37 due to family history . Patient will be scheduled prior to leaving office for mammogram and [place].  Labs: CMP, Lipid panel,  Gc/chlamydia, BV, yeast and trichomonas  Discussed weight loss would help with overall health and period profile change possible. Patient will consider.  Pap smear: yes   counseled on breast self exam, mammography screening, adequate intake of calcium and vitamin D, diet and exercise  return annually or prn  An After Visit Summary was printed and given to the patient.

## 2018-09-03 NOTE — Progress Notes (Signed)
Patient scheduled while in office for bilateral Dx MMG and left breast US, if needed. Scheduled at [place] on 09/08/18 at 8:20am, arriving at 8am. Patient verbalizes understanding and is agreeable.

## 2018-09-04 LAB — COMPREHENSIVE METABOLIC PANEL
ALBUMIN: 3.8 g/dL (ref 3.5–5.5)
ALT: 12 IU/L (ref 0–32)
AST: 11 IU/L (ref 0–40)
Albumin/Globulin Ratio: 1.4 (ref 1.2–2.2)
Alkaline Phosphatase: 77 IU/L (ref 39–117)
BUN / CREAT RATIO: 15 (ref 9–23)
BUN: 12 mg/dL (ref 6–20)
Bilirubin Total: <0.2 mg/dL (ref 0.0–1.2)
CALCIUM: 8.9 mg/dL (ref 8.7–10.2)
CO2: 22 mmol/L (ref 20–29)
CREATININE: 0.79 mg/dL (ref 0.57–1.00)
Chloride: 104 mmol/L (ref 96–106)
GFR calc Af Amer: 111 mL/min/{1.73_m2} (ref >59)
GFR, EST NON AFRICAN AMERICAN: 96 mL/min/{1.73_m2} (ref >59)
GLOBULIN, TOTAL: 2.8 g/dL (ref 1.5–4.5)
GLUCOSE: 84 mg/dL (ref 65–99)
Potassium: 4.4 mmol/L (ref 3.5–5.2)
SODIUM: 140 mmol/L (ref 134–144)
Total Protein: 6.6 g/dL (ref 6.0–8.5)

## 2018-09-04 LAB — IRON AND TIBC
IRON SATURATION: 4 % — AB (ref 15–55)
IRON: 16 ug/dL — AB (ref 27–159)
TIBC: 378 ug/dL (ref 250–450)
UIBC: 362 ug/dL (ref 131–425)

## 2018-09-04 LAB — LIPID PANEL
CHOLESTEROL TOTAL: 151 mg/dL (ref 100–199)
Chol/HDL Ratio: 3.5 ratio (ref 0.0–4.4)
HDL: 43 mg/dL (ref >39)
LDL Calculated: 73 mg/dL (ref 0–99)
Triglycerides: 175 mg/dL — ABNORMAL HIGH (ref 0–149)
VLDL Cholesterol Cal: 35 mg/dL (ref 5–40)

## 2018-09-04 LAB — PROLACTIN: Prolactin: 16 ng/mL (ref 4.8–23.3)

## 2018-09-04 LAB — CBC
HEMOGLOBIN: 9.5 g/dL — AB (ref 11.1–15.9)
Hematocrit: 32.4 % — ABNORMAL LOW (ref 34.0–46.6)
MCH: 21.1 pg — ABNORMAL LOW (ref 26.6–33.0)
MCHC: 29.3 g/dL — ABNORMAL LOW (ref 31.5–35.7)
MCV: 72 fL — ABNORMAL LOW (ref 79–97)
PLATELETS: 374 10*3/uL (ref 150–450)
RBC: 4.51 x10E6/uL (ref 3.77–5.28)
RDW: 16.1 % — ABNORMAL HIGH (ref 12.3–15.4)
WBC: 8.9 10*3/uL (ref 3.4–10.8)

## 2018-09-04 LAB — TSH: TSH: 3.47 u[IU]/mL (ref 0.450–4.500)

## 2018-09-04 LAB — FOLLICLE STIMULATING HORMONE: FSH: 1.4 m[IU]/mL

## 2018-09-04 LAB — VITAMIN D 25 HYDROXY (VIT D DEFICIENCY, FRACTURES): VIT D 25 HYDROXY: 26.6 ng/mL — AB (ref 30.0–100.0)

## 2018-09-05 ENCOUNTER — Other Ambulatory Visit: Payer: Self-pay | Admitting: Certified Nurse Midwife

## 2018-09-05 DIAGNOSIS — R899 Unspecified abnormal finding in specimens from other organs, systems and tissues: Secondary | ICD-10-CM

## 2018-09-05 DIAGNOSIS — E559 Vitamin D deficiency, unspecified: Secondary | ICD-10-CM

## 2018-09-05 DIAGNOSIS — D500 Iron deficiency anemia secondary to blood loss (chronic): Secondary | ICD-10-CM

## 2018-09-05 DIAGNOSIS — N921 Excessive and frequent menstruation with irregular cycle: Secondary | ICD-10-CM

## 2018-09-06 ENCOUNTER — Telehealth: Payer: Self-pay | Admitting: *Deleted

## 2018-09-06 DIAGNOSIS — N921 Excessive and frequent menstruation with irregular cycle: Secondary | ICD-10-CM

## 2018-09-06 NOTE — Telephone Encounter (Signed)
Message left to return call to Triage Nurse at [phone].    

## 2018-09-06 NOTE — Telephone Encounter (Signed)
Patient returned call. All results reviewed with patient and she verbalized understanding. Patient requests to scheduled PUS on 10-10. PUS appointment scheduled for 09-16-18 at 1430, with 1500 consult with Dr. [name]. Patient agreeable to date and time of appointment. Future order present for PUS. Patient requests to look at her calendar and return call to schedule 1 month and 3 month lab rechecks. Future orders present for lab work. Patient aware to start OTC vitamin d and iron and continue taking PNV.   Routing to provider for final review. Patient agreeable to disposition. Will close encounter.   '

## 2018-09-06 NOTE — Telephone Encounter (Signed)
-----   Message from Regina Eck, CNM sent at 09/05/2018  9:12 PM EDT ----- Notify patient that her Vitamin D is low at 26.6, needs to start on Vitamin D 3 1000 IU daily and recheck in 3 months, please schedule Lipid panel is normal except for elevated triglycerides, needs to work on limited sugar intake and fatty food intake. Work on [employer] protein and vegetables, daily exercise recheck in 3 months with Vitamin D  Orders in Liver, kidney and glucose profile is normal TSH, FSH and Prolactin are normal May need provera challenge if PUS normal and consider as we discussed POP for cycle and bleeding control, due to history of anemia CBC still shows anemia and TIBC shows low iron and saturation Start on OTC time release iron one daily and continue Prenatal vitamin  Recheck TIBC and CBC in 4 weeks orders placed

## 2018-09-07 ENCOUNTER — Telehealth: Payer: Self-pay

## 2018-09-07 LAB — CYTOLOGY - PAP
Bacterial vaginitis: POSITIVE — AB
Candida vaginitis: NEGATIVE
Chlamydia: NEGATIVE
DIAGNOSIS: NEGATIVE
HPV (WINDOPATH): NOT DETECTED
Neisseria Gonorrhea: NEGATIVE
Trichomonas: NEGATIVE

## 2018-09-07 NOTE — Telephone Encounter (Signed)
Left message for call back.

## 2018-09-08 ENCOUNTER — Ambulatory Visit
Admission: RE | Admit: 2018-09-08 | Discharge: 2018-09-08 | Disposition: A | Discharge status: Home / Self Care | Payer: 59 | Source: Ambulatory Visit | Attending: Certified Nurse Midwife | Admitting: Certified Nurse Midwife

## 2018-09-08 DIAGNOSIS — N6320 Unspecified lump in the left breast, unspecified quadrant: Secondary | ICD-10-CM

## 2018-09-08 NOTE — Telephone Encounter (Signed)
Call placed to convey benefits. 

## 2018-09-09 MED ORDER — METRONIDAZOLE 500 MG PO TABS: 500.0000 mg | ORAL_TABLET | Freq: Two times a day (BID) | ORAL | 0 refills | Status: DC

## 2018-09-09 NOTE — Telephone Encounter (Signed)
Patient notified of lab results. Telephone encounter routed to [facility], regarding her note about benefits.

## 2018-09-10 NOTE — Telephone Encounter (Signed)
Call placed to patient to review benefits for a scheduled ultrasound appointment. Left voicemail message requesting a return call °

## 2018-09-13 NOTE — Progress Notes (Signed)
GYNECOLOGY  VISIT   HPI: 37 y.o.   Married Other or two or more races Hispanic or Latino  female   [phone] with Patient's last menstrual period was 04/18/2018 (exact date).   here for consult following PUS.  The patient has a h/o oligomenorrhea and then heavy extended bleeding. Recent normal TSH and prolactin, premenopausal FSH. Hgb 9.5 (2 weeks ago) with low iron stores. She is taking PNV and has an iron script waiting for her.  Prior to 3/18 her cycles were monthly x 3-4 days. She would saturate a pad in 2 hours. She has always had iron def anemia. In the last 1.5 years she has only had 2-3 cycles, they have all lasted for 50+ days. Last episode of bleeding ended around August 5 (started May 10). Heavy the whole time. She could saturate a pad in 30 minutes.  No hirsutism or acne.  She has lost ~30 lbs in the last 1.5 years.  She was on the mini-pill in the past and didn't like it.   Non smoker, no contraindications to OCP's  GYNECOLOGIC HISTORY: Patient's last menstrual period was 04/18/2018 (exact date). Contraception: tubal ligation Menopausal hormone therapy: None        OB History    Gravida  5   Para  4   Term  4   Preterm  0   AB  1   Living  0     SAB  1   TAB  0   Ectopic  0   Multiple  0   Live Births  0              Patient Active Problem List   Diagnosis Date Noted  . H/O tubal ligation 06/29/2018  . Anemia 06/29/2018  . Menometrorrhagia 06/29/2018    Past Medical History:  Diagnosis Date  . Abnormal Pap smear of cervix   . Abnormal uterine bleeding   . Anemia   . Anxiety   . Asthma    in the past  . Depression   . Medical history non-contributory   . Migraines   . Ovarian cyst     Past Surgical History:  Procedure Laterality Date  . CESAREAN SECTION     times 2  . CESAREAN SECTION WITH BILATERAL TUBAL LIGATION    . COLPOSCOPY    . TUBAL LIGATION      Current Outpatient Medications  Medication Sig Dispense Refill  . EVENING  PRIMROSE OIL PO Take by mouth.    . Ginger, Zingiber officinalis, (GINGER ROOT PO) Take by mouth.    . Ginkgo Biloba 40 MG TABS Take by mouth.    . Prenatal Vit-Fe Fumarate-FA (PRENATAL VITAMIN PO) Take by mouth.    . metroNIDAZOLE (FLAGYL) 500 MG tablet Take 1 tablet (500 mg total) by mouth 2 (two) times daily. 14 tablet 0   No current facility-administered medications for this visit.      ALLERGIES: Bee venom and Codeine  Family History  Problem Relation Age of Onset  . Breast cancer Mother   . Hypertension Mother   . Diabetes Father   . Hypertension Father   . Stroke Father   . Heart attack Father   . Miscarriages / Stillbirths Neg Hx   . Obesity Neg Hx   . Vision loss Neg Hx   . Varicose Veins Neg Hx   Mom with breast cancer 51.  Social History   Socioeconomic History  . Marital status: Married    Spouse  name: Not on file  . Number of children: Not on file  . Years of education: Not on file  . Highest education level: Not on file  Occupational History  . Not on file  Social Needs  . Financial resource strain: Not on file  . Food insecurity:    Worry: Not on file    Inability: Not on file  . Transportation needs:    Medical: Not on file    Non-medical: Not on file  Tobacco Use  . Smoking status: Never Smoker  . Smokeless tobacco: Never Used  Substance and Sexual Activity  . Alcohol use: No  . Drug use: No  . Sexual activity: Yes    Partners: Male    Birth control/protection: Other-see comments    Comment: btl  Lifestyle  . Physical activity:    Days per week: Not on file    Minutes per session: Not on file  . Stress: Not on file  Relationships  . Social connections:    Talks on phone: Not on file    Gets together: Not on file    Attends religious service: Not on file    Active member of club or organization: Not on file    Attends meetings of clubs or organizations: Not on file    Relationship status: Not on file  . Intimate partner violence:     Fear of current or ex partner: Not on file    Emotionally abused: Not on file    Physically abused: Not on file    Forced sexual activity: Not on file  Other Topics Concern  . Not on file  Social History Narrative  . Not on file    Review of Systems  Constitutional:       Weight gain  HENT: Negative.   Eyes: Negative.   Respiratory: Negative.   Cardiovascular: Negative.   Endocrine: Negative.   Genitourinary:       Loss of sexual interest Breast pain  Musculoskeletal: Negative.   Skin: Negative.   Allergic/Immunologic: Negative.   Neurological: Positive for headaches.  Hematological: Negative.   Psychiatric/Behavioral:       Depression    PHYSICAL EXAMINATION:    BP 128/80 (BP Location: Right Arm, Patient Position: Sitting, Cuff Size: Normal)   Pulse 76   Wt (!) 305 lb (138.3 kg)   LMP 04/18/2018 (Exact Date)   BMI 51.95 kg/m     General appearance: alert, cooperative and appears stated age  Reviewed ultrasound images with the patient and her husband. Normal uterus with one small intramural myoma, thin stripe of 4.45 mm. She did have a complex cyst in her left adnexa, ? Tube. Normal right ovary  ASSESSMENT Oligomenorrhea, normal TSH, prolactin, pre-menopausal FSH Menometrorrhagia Iron def anemia Adnexal cyst   PLAN Provera 5 mg x 5 days now, call with or without bleed Take her iron as prescribed Discussed option of cyclic provera, OCP's, nuvaring, and mirena IUD She would like an IUD (nuvaring as second option) Return for f/u ultrasound in 2 months to f/u on adnexal cyst   An After Visit Summary was printed and given to the patient.  ~20 minutes face to face time of which over 50% was spent in counseling.    CC: Evalee Mutton, CNM

## 2018-09-14 ENCOUNTER — Telehealth: Payer: Self-pay | Admitting: *Deleted

## 2018-09-14 NOTE — Telephone Encounter (Signed)
Spoke with patient and breast check scheduled for 09/22/18 Will call back if any questions or concerns prior to appointment.

## 2018-09-14 NOTE — Telephone Encounter (Signed)
-----   Message from Regina Eck, CNM sent at 09/10/2018  7:50 AM EDT ----- Patient needs recheck of breast, mammogram and [place] were no suspicious finding, but felt area is dense tissue. Need recheck for tenderness in 2 weeks

## 2018-09-14 NOTE — Telephone Encounter (Signed)
Notes recorded by Burnice Logan, RN on 09/14/2018 at 11:20 AM EDT Left message to call Sharee Pimple, RN at [place].

## 2018-09-16 ENCOUNTER — Encounter: Payer: Self-pay | Admitting: Obstetrics and Gynecology

## 2018-09-16 ENCOUNTER — Ambulatory Visit (INDEPENDENT_AMBULATORY_CARE_PROVIDER_SITE_OTHER): Payer: 59

## 2018-09-16 ENCOUNTER — Ambulatory Visit: Payer: 59 | Admitting: Obstetrics and Gynecology

## 2018-09-16 ENCOUNTER — Other Ambulatory Visit: Payer: Self-pay

## 2018-09-16 VITALS — BP 128/80 | HR 76 | Wt 305.0 lb

## 2018-09-16 DIAGNOSIS — N921 Excessive and frequent menstruation with irregular cycle: Secondary | ICD-10-CM | POA: Diagnosis not present

## 2018-09-16 DIAGNOSIS — N949 Unspecified condition associated with female genital organs and menstrual cycle: Secondary | ICD-10-CM

## 2018-09-16 DIAGNOSIS — D500 Iron deficiency anemia secondary to blood loss (chronic): Secondary | ICD-10-CM

## 2018-09-16 DIAGNOSIS — N914 Secondary oligomenorrhea: Secondary | ICD-10-CM

## 2018-09-16 MED ORDER — METRONIDAZOLE 500 MG PO TABS: 500.0000 mg | ORAL_TABLET | Freq: Two times a day (BID) | ORAL | 0 refills | Status: DC

## 2018-09-16 MED ORDER — MEDROXYPROGESTERONE ACETATE 5 MG PO TABS: 5.0000 mg | ORAL_TABLET | Freq: Every day | ORAL | 0 refills | Status: DC

## 2018-09-16 NOTE — Telephone Encounter (Signed)
Please close encounter if no further information needed for patient.

## 2018-09-16 NOTE — Patient Instructions (Signed)
Take the provera, one tablet a day for 5 days. Call with or without a cycle (you should bleed within 2 weeks)

## 2018-09-17 NOTE — Telephone Encounter (Signed)
Call placed to convey benefits for IUD. °

## 2018-09-22 ENCOUNTER — Ambulatory Visit: Payer: Self-pay | Admitting: Certified Nurse Midwife

## 2018-09-27 ENCOUNTER — Telehealth: Payer: Self-pay | Admitting: Obstetrics and Gynecology

## 2018-09-27 ENCOUNTER — Telehealth: Payer: Self-pay | Admitting: Certified Nurse Midwife

## 2018-09-27 NOTE — Telephone Encounter (Signed)
Patient would like to know her benefits information again for IUD placement. States can leave a detailed voicemail on her cell [phone].

## 2018-09-28 NOTE — Telephone Encounter (Signed)
Patient returning Rosa's call.

## 2018-09-29 ENCOUNTER — Ambulatory Visit: Payer: 59 | Admitting: Certified Nurse Midwife

## 2018-09-29 MED ORDER — ETONOGESTREL-ETHINYL ESTRADIOL 0.12-0.015 MG/24HR VA RING: 1.0000 | VAGINAL_RING | VAGINAL | 0 refills | Status: DC

## 2018-09-29 NOTE — Telephone Encounter (Signed)
Left detailed message, ok per dpr. Advised as seen below per Dr. [name]. Rx for Nuvaring has been faxed to [facility] location. Return call to office if any additional questions. Encounter closed.

## 2018-09-29 NOTE — Telephone Encounter (Signed)
Routing to Dr. [name] to advise on Nuvaring Rx.

## 2018-09-29 NOTE — Telephone Encounter (Signed)
Call placed to convey benefits. 

## 2018-09-29 NOTE — Telephone Encounter (Signed)
3 nuvarings sent to the pharmacy. She should start the nuvaring when she starts bleeding after finishing the provera. If she doesn't bleed after the provera she should call.  We can f/u at her next ultrasound appointment.

## 2018-09-29 NOTE — Telephone Encounter (Signed)
I have conveyed benefits for Mirena with the patient. She has declined the Mirena and would like to get the Nuvring instead.

## 2018-11-08 ENCOUNTER — Telehealth: Payer: Self-pay | Admitting: Obstetrics and Gynecology

## 2018-11-08 NOTE — Telephone Encounter (Signed)
Terri Walker returned call.Spoke with Terri Walker regarding benefit for an ultrasound. Terri Walker understood and agreeable. Terri Walker requested to reschedule from 11/18/18 to 11/25/18 with Dr [name]. Appointment date changed, as reqeusted. Terri Walker aware of appointment date, arrival time and policy. No further questions.   Forwarding to Dr [name] for final review. Terri Walker agreeable to disposition. Will close encounter

## 2018-11-08 NOTE — Telephone Encounter (Signed)
Call placed to patient to review benefits for an upcoming appointment scheduled for 11/18/18. Patient advised she was at work and could not talk. States she will call back

## 2018-11-18 ENCOUNTER — Other Ambulatory Visit: Payer: 59 | Admitting: Obstetrics and Gynecology

## 2018-11-18 ENCOUNTER — Other Ambulatory Visit: Payer: 59

## 2018-11-23 ENCOUNTER — Telehealth: Payer: Self-pay | Admitting: Obstetrics and Gynecology

## 2018-11-23 NOTE — Telephone Encounter (Signed)
Patient canceled her 2 month follow up and PUS 11/25/18 via the automated reminder call. I left her a message that someone would call her to reschedule To Deloris Ping and Rosa to contact patient.Terri Walker

## 2018-11-25 ENCOUNTER — Other Ambulatory Visit: Payer: 59 | Admitting: Obstetrics and Gynecology

## 2018-11-25 ENCOUNTER — Other Ambulatory Visit: Payer: 59

## 2018-12-23 ENCOUNTER — Other Ambulatory Visit (INDEPENDENT_AMBULATORY_CARE_PROVIDER_SITE_OTHER): Payer: 59

## 2018-12-23 DIAGNOSIS — N899 Noninflammatory disorder of vagina, unspecified: Secondary | ICD-10-CM

## 2018-12-23 DIAGNOSIS — D500 Iron deficiency anemia secondary to blood loss (chronic): Secondary | ICD-10-CM

## 2018-12-23 DIAGNOSIS — E559 Vitamin D deficiency, unspecified: Secondary | ICD-10-CM

## 2018-12-23 DIAGNOSIS — R899 Unspecified abnormal finding in specimens from other organs, systems and tissues: Secondary | ICD-10-CM

## 2018-12-24 ENCOUNTER — Telehealth: Payer: Self-pay

## 2018-12-24 ENCOUNTER — Other Ambulatory Visit: Payer: Self-pay | Admitting: Certified Nurse Midwife

## 2018-12-24 ENCOUNTER — Other Ambulatory Visit: Payer: Self-pay

## 2018-12-24 DIAGNOSIS — E559 Vitamin D deficiency, unspecified: Secondary | ICD-10-CM

## 2018-12-24 DIAGNOSIS — D508 Other iron deficiency anemias: Secondary | ICD-10-CM

## 2018-12-24 LAB — TRIGLYCERIDES: TRIGLYCERIDES: 139 mg/dL (ref 0–149)

## 2018-12-24 LAB — IRON AND TIBC
Iron Saturation: 4 % — CL (ref 15–55)
Iron: 19 ug/dL — ABNORMAL LOW (ref 27–159)
TIBC: 426 ug/dL (ref 250–450)
UIBC: 407 ug/dL (ref 131–425)

## 2018-12-24 LAB — VITAMIN D 25 HYDROXY (VIT D DEFICIENCY, FRACTURES): Vit D, 25-Hydroxy: 17.7 ng/mL — ABNORMAL LOW (ref 30.0–100.0)

## 2018-12-24 MED ORDER — FUSION PLUS PO CAPS: 1.0000 | ORAL_CAPSULE | Freq: Two times a day (BID) | ORAL | 1 refills | Status: DC

## 2018-12-24 MED ORDER — ETONOGESTREL-ETHINYL ESTRADIOL 0.12-0.015 MG/24HR VA RING: 1.0000 | VAGINAL_RING | VAGINAL | 0 refills | Status: DC

## 2018-12-24 MED ORDER — VITAMIN D (ERGOCALCIFEROL) 1.25 MG (50000 UNIT) PO CAPS: 50000.0000 [IU] | ORAL_CAPSULE | ORAL | 0 refills | Status: DC

## 2018-12-24 NOTE — Telephone Encounter (Signed)
Call to patient, no answer, left detailed message, ok per dpr. Advised Rx for Nuvaring #1/0RF to [facility] [address]. Return call to office to schedule f/u PUS with Dr. [name]. Advised our office phones go off at 4:30pm, return on at 8am Monday morning.

## 2018-12-24 NOTE — Telephone Encounter (Signed)
Left message for call back.

## 2018-12-24 NOTE — Telephone Encounter (Signed)
Please contact patient regarding her need for a follow up ultrasound with Dr. [name].  She cancelled her appointment in December.   I will refill her NuvaRing for one month only.

## 2018-12-24 NOTE — Telephone Encounter (Signed)
Medication refill request: Nuvaring  Last AEX: 09-03-18 with Johny Shock,  last appt with Dr [name] 10/19 & nuvaring was given then  Next AEX: not scheduled  Last MMG (if hormonal medication request): Bilateral & left breast category c density birads 1:neg  Refill authorized: Patient states the nuvaring is working well for her. She is on her cycle now & is due to put a nuvaring back in within a few days. Patient is asking for refill of nuvaring. Please approve if appropriate & amount patient can have.

## 2018-12-27 NOTE — Telephone Encounter (Signed)
Left message to call Sharee Pimple, RN at [facility].    Needs f/u PUS for left adnexal cyst

## 2018-12-27 NOTE — Telephone Encounter (Signed)
Patient is returning a call. °

## 2018-12-27 NOTE — Telephone Encounter (Signed)
Spoke with patient.   1. Patient declines to schedule PUS at this time, will return call to reschedule prior to [place] [facility] running out.   2. Patient requesting alternative to Fusion Plus due to cost. Currently taking multivitamin daily, no additional iron supplements.   Advised will review with Melvia Heaps, CNM and return call, patient request detailed message if no answer.   Melvia Heaps, CNM -ok to take slow fe BID as alternative iron supplement.

## 2018-12-28 NOTE — Telephone Encounter (Signed)
Spoke with patient, advised as seen below per Melvia Heaps, CNM. Patient verbalizes understanding and is agreeable.   Routing to provider for final review. Patient is agreeable to disposition. Will close encounter.   Cc: Dr. [name]

## 2018-12-28 NOTE — Telephone Encounter (Signed)
Ok to do OTC, make sure she takes with Vitamin C source for best absorption and increase iron foods in diet.

## 2019-02-14 ENCOUNTER — Telehealth: Payer: Self-pay | Admitting: Obstetrics and Gynecology

## 2019-02-14 DIAGNOSIS — N949 Unspecified condition associated with female genital organs and menstrual cycle: Secondary | ICD-10-CM

## 2019-02-14 NOTE — Telephone Encounter (Signed)
Patient is ready to schedule her 2 month f/u with PUS. She would like a Thursday if possible.

## 2019-02-14 NOTE — Telephone Encounter (Signed)
Ultrasound for follow up ovarian cyst scheduled 02/24/2019.  Order placed for precert.  cc Deloris Ping Dixon/Rosa Davis for [employer], referral processing and patient contact.

## 2019-02-22 NOTE — Progress Notes (Deleted)
GYNECOLOGY  VISIT   HPI: 38 y.o.   Married Other or two or more races Hispanic or Latino  female   [phone] with No LMP recorded.   here for consult following PUS.  GYNECOLOGIC HISTORY: No LMP recorded. Contraception:*** Menopausal hormone therapy: ***        OB History    Gravida  5   Para  4   Term  4   Preterm  0   AB  1   Living  0     SAB  1   TAB  0   Ectopic  0   Multiple  0   Live Births  0              Patient Active Problem List   Diagnosis Date Noted  . H/O tubal ligation 06/29/2018  . Anemia 06/29/2018  . Menometrorrhagia 06/29/2018    Past Medical History:  Diagnosis Date  . Abnormal Pap smear of cervix   . Abnormal uterine bleeding   . Anemia   . Anxiety   . Asthma    in the past  . Depression   . Medical history non-contributory   . Migraine without aura   . Ovarian cyst     Past Surgical History:  Procedure Laterality Date  . CESAREAN SECTION     times 2  . CESAREAN SECTION WITH BILATERAL TUBAL LIGATION    . COLPOSCOPY    . TUBAL LIGATION      Current Outpatient Medications  Medication Sig Dispense Refill  . etonogestrel-ethinyl estradiol (NUVARING) 0.12-0.015 MG/24HR vaginal ring Place 1 each vaginally every 28 (twenty-eight) days. Insert vaginally and leave in place for 3 consecutive weeks, then remove for 1 week. 1 each 0  . EVENING PRIMROSE OIL PO Take by mouth.    . Ginger, Zingiber officinalis, (GINGER ROOT PO) Take by mouth.    . Ginkgo Biloba 40 MG TABS Take by mouth.    . Iron-FA-B Cmp-C-Biot-Probiotic (FUSION PLUS) CAPS Take 1 capsule by mouth 2 (two) times daily. 60 capsule 1  . medroxyPROGESTERone (PROVERA) 5 MG tablet Take 1 tablet (5 mg total) by mouth daily. 5 tablet 0  . Prenatal Vit-Fe Fumarate-FA (PRENATAL VITAMIN PO) Take by mouth.    . Vitamin D, Ergocalciferol, (DRISDOL) 1.25 MG (50000 UT) CAPS capsule Take 1 capsule (50,000 Units total) by mouth every 7 (seven) days. 12 capsule 0   No current  facility-administered medications for this visit.      ALLERGIES: Bee venom and Codeine  Family History  Problem Relation Age of Onset  . Breast cancer Mother   . Hypertension Mother   . Diabetes Father   . Hypertension Father   . Stroke Father   . Heart attack Father   . Miscarriages / Stillbirths Neg Hx   . Obesity Neg Hx   . Vision loss Neg Hx   . Varicose Veins Neg Hx     Social History   Socioeconomic History  . Marital status: Married    Spouse name: Not on file  . Number of children: Not on file  . Years of education: Not on file  . Highest education level: Not on file  Occupational History  . Not on file  Social Needs  . Financial resource strain: Not on file  . Food insecurity:    Worry: Not on file    Inability: Not on file  . Transportation needs:    Medical: Not on file    Non-medical:  Not on file  Tobacco Use  . Smoking status: Never Smoker  . Smokeless tobacco: Never Used  Substance and Sexual Activity  . Alcohol use: No  . Drug use: No  . Sexual activity: Yes    Partners: Male    Birth control/protection: Other-see comments    Comment: btl  Lifestyle  . Physical activity:    Days per week: Not on file    Minutes per session: Not on file  . Stress: Not on file  Relationships  . Social connections:    Talks on phone: Not on file    Gets together: Not on file    Attends religious service: Not on file    Active member of club or organization: Not on file    Attends meetings of clubs or organizations: Not on file    Relationship status: Not on file  . Intimate partner violence:    Fear of current or ex partner: Not on file    Emotionally abused: Not on file    Physically abused: Not on file    Forced sexual activity: Not on file  Other Topics Concern  . Not on file  Social History Narrative  . Not on file    ROS  PHYSICAL EXAMINATION:    There were no vitals taken for this visit.    General appearance: alert, cooperative and  appears stated age Neck: no adenopathy, supple, symmetrical, trachea midline and thyroid {CHL AMB PHY EX THYROID NORM [phone]::"normal to inspection and palpation"} Breasts: {Exam; breast:13139::"normal appearance, no masses or tenderness"} Abdomen: soft, non-tender; non distended, no masses,  no organomegaly  Pelvic: External genitalia:  no lesions              Urethra:  normal appearing urethra with no masses, tenderness or lesions              Bartholins and Skenes: normal                 Vagina: normal appearing vagina with normal color and discharge, no lesions              Cervix: {CHL AMB PHY EX CERVIX NORM DEFAULT:[phone]::"no lesions"}              Bimanual Exam:  Uterus:  {CHL AMB PHY EX UTERUS NORM [phone]::"normal size, contour, position, consistency, mobility, non-tender"}              Adnexa: {CHL AMB PHY EX ADNEXA NO MASS DEFAULT:[phone]::"no mass, fullness, tenderness"}              Rectovaginal: {yes no:[id number]}.  Confirms.              Anus:  normal sphincter tone, no lesions  Chaperone was present for exam.  ASSESSMENT     PLAN    An After Visit Summary was printed and given to the patient.  *** minutes face to face time of which over 50% was spent in counseling.

## 2019-02-24 ENCOUNTER — Ambulatory Visit (INDEPENDENT_AMBULATORY_CARE_PROVIDER_SITE_OTHER): Payer: 59 | Admitting: Obstetrics and Gynecology

## 2019-02-24 ENCOUNTER — Ambulatory Visit (INDEPENDENT_AMBULATORY_CARE_PROVIDER_SITE_OTHER): Payer: 59

## 2019-02-24 ENCOUNTER — Other Ambulatory Visit: Payer: Self-pay

## 2019-02-24 ENCOUNTER — Other Ambulatory Visit: Payer: Self-pay | Admitting: Obstetrics and Gynecology

## 2019-02-24 ENCOUNTER — Encounter: Payer: Self-pay | Admitting: Obstetrics and Gynecology

## 2019-02-24 VITALS — BP 128/82 | HR 76 | Wt 308.0 lb

## 2019-02-24 DIAGNOSIS — Z862 Personal history of diseases of the blood and blood-forming organs and certain disorders involving the immune mechanism: Secondary | ICD-10-CM

## 2019-02-24 DIAGNOSIS — R030 Elevated blood-pressure reading, without diagnosis of hypertension: Secondary | ICD-10-CM

## 2019-02-24 DIAGNOSIS — N83202 Unspecified ovarian cyst, left side: Secondary | ICD-10-CM

## 2019-02-24 DIAGNOSIS — N949 Unspecified condition associated with female genital organs and menstrual cycle: Secondary | ICD-10-CM

## 2019-02-24 DIAGNOSIS — Z3044 Encounter for surveillance of vaginal ring hormonal contraceptive device: Secondary | ICD-10-CM | POA: Diagnosis not present

## 2019-02-24 MED ORDER — MEDROXYPROGESTERONE ACETATE 5 MG PO TABS: 5.0000 mg | ORAL_TABLET | Freq: Every day | ORAL | 0 refills | Status: DC

## 2019-02-24 MED ORDER — ETONOGESTREL-ETHINYL ESTRADIOL 0.12-0.015 MG/24HR VA RING: 1.0000 | VAGINAL_RING | VAGINAL | 0 refills | Status: DC

## 2019-02-24 NOTE — Progress Notes (Signed)
GYNECOLOGY  VISIT   HPI: 38 y.o.   Married Other or two or more races Hispanic or Latino  female   [phone] with Patient's last menstrual period was 01/21/2019 (exact date).   here for consult following PUS.  The patient was noted to have a complex left adnexal cyst in the fall. She had the ultrasound secondary to AUB leading to significant anemia.  She was started on the nuvaring in the fall. She was getting a cycle monthly x 4-6 days. Moderate, changing a pad every 2 hours. LMP was in 2/20 after she took out her last nuvaring.     GYNECOLOGIC HISTORY: Patient's last menstrual period was 01/21/2019 (exact date). Contraception: BTL Menopausal hormone therapy: None        OB History    Gravida  5   Para  4   Term  4   Preterm  0   AB  1   Living  0     SAB  1   TAB  0   Ectopic  0   Multiple  0   Live Births  0              Patient Active Problem List   Diagnosis Date Noted  . H/O tubal ligation 06/29/2018  . Anemia 06/29/2018  . Menometrorrhagia 06/29/2018    Past Medical History:  Diagnosis Date  . Abnormal Pap smear of cervix   . Abnormal uterine bleeding   . Anemia   . Anxiety   . Asthma    in the past  . Depression   . Medical history non-contributory   . Migraine without aura   . Ovarian cyst     Past Surgical History:  Procedure Laterality Date  . CESAREAN SECTION     times 2  . CESAREAN SECTION WITH BILATERAL TUBAL LIGATION    . COLPOSCOPY    . TUBAL LIGATION      Current Outpatient Medications  Medication Sig Dispense Refill  . etonogestrel-ethinyl estradiol (NUVARING) 0.12-0.015 MG/24HR vaginal ring Place 1 each vaginally every 28 (twenty-eight) days. Insert vaginally and leave in place for 3 consecutive weeks, then remove for 1 week. 1 each 0  . EVENING PRIMROSE OIL PO Take by mouth.    . Ginger, Zingiber officinalis, (GINGER ROOT PO) Take by mouth.    . Prenatal Vit-Fe Fumarate-FA (PRENATAL VITAMIN PO) Take by mouth.     No  current facility-administered medications for this visit.      ALLERGIES: Bee venom and Codeine  Family History  Problem Relation Age of Onset  . Breast cancer Mother   . Hypertension Mother   . Diabetes Father   . Hypertension Father   . Stroke Father   . Heart attack Father   . Miscarriages / Stillbirths Neg Hx   . Obesity Neg Hx   . Vision loss Neg Hx   . Varicose Veins Neg Hx     Social History   Socioeconomic History  . Marital status: Married    Spouse name: Not on file  . Number of children: Not on file  . Years of education: Not on file  . Highest education level: Not on file  Occupational History  . Not on file  Social Needs  . Financial resource strain: Not on file  . Food insecurity:    Worry: Not on file    Inability: Not on file  . Transportation needs:    Medical: Not on file    Non-medical:  Not on file  Tobacco Use  . Smoking status: Never Smoker  . Smokeless tobacco: Never Used  Substance and Sexual Activity  . Alcohol use: No  . Drug use: No  . Sexual activity: Yes    Partners: Male    Birth control/protection: Other-see comments    Comment: btl  Lifestyle  . Physical activity:    Days per week: Not on file    Minutes per session: Not on file  . Stress: Not on file  Relationships  . Social connections:    Talks on phone: Not on file    Gets together: Not on file    Attends religious service: Not on file    Active member of club or organization: Not on file    Attends meetings of clubs or organizations: Not on file    Relationship status: Not on file  . Intimate partner violence:    Fear of current or ex partner: Not on file    Emotionally abused: Not on file    Physically abused: Not on file    Forced sexual activity: Not on file  Other Topics Concern  . Not on file  Social History Narrative  . Not on file    Review of Systems  Constitutional: Negative.   HENT: Negative.   Eyes: Negative.   Respiratory: Negative.    Cardiovascular: Negative.   Gastrointestinal: Negative.   Genitourinary: Negative.   Musculoskeletal: Negative.   Skin: Negative.   Neurological: Negative.   Endo/Heme/Allergies: Negative.   Psychiatric/Behavioral: Negative.     PHYSICAL EXAMINATION:    BP 140/80 (BP Location: Right Arm, Patient Position: Sitting, Cuff Size: Large)   Pulse 76   Wt (!) 308 lb (139.7 kg)   LMP 01/21/2019 (Exact Date)   BMI 52.46 kg/m     General appearance: alert, cooperative and appears stated age   Ultrasound images reviewed with the patient and her husband.   ASSESSMENT Multicystic left ovary, stable, benign appearing H/O oligomenorrhea followed by prolonged heavy bleeding, controlled with the nuvaring Elevated BP without diagnosis of HTN    PLAN Plan f/u ultrasound in one year Repeat BP 128/82 Will call in provera x 5 days, when she starts her cycle she can restart the nuvaring F/U in 3 months for ring check and repeat BP CBC, Ferritin today   An After Visit Summary was printed and given to the patient.

## 2019-02-25 ENCOUNTER — Telehealth: Payer: Self-pay | Admitting: Obstetrics and Gynecology

## 2019-02-25 LAB — CBC
Hematocrit: 35.8 % (ref 34.0–46.6)
Hemoglobin: 10.2 g/dL — ABNORMAL LOW (ref 11.1–15.9)
MCH: 20.8 pg — ABNORMAL LOW (ref 26.6–33.0)
MCHC: 28.5 g/dL — ABNORMAL LOW (ref 31.5–35.7)
MCV: 73 fL — ABNORMAL LOW (ref 79–97)
Platelets: 383 10*3/uL (ref 150–450)
RBC: 4.91 x10E6/uL (ref 3.77–5.28)
RDW: 18.7 % — ABNORMAL HIGH (ref 11.7–15.4)
WBC: 7.6 10*3/uL (ref 3.4–10.8)

## 2019-02-25 LAB — FERRITIN: Ferritin: 5 ng/mL — ABNORMAL LOW (ref 15–150)

## 2019-02-25 NOTE — Telephone Encounter (Signed)
Call to patient.  She is advised to have [facility] call and transfer her prescription from [place] [place].  She is agreeable.  Encounter closed.

## 2019-02-25 NOTE — Telephone Encounter (Signed)
Patient is asking if her prescriptions from yesterday can be sent to [facility] at [phone]. They were sent to [facility] but pharmacy is closed.

## 2019-05-16 IMAGING — MG DIGITAL DIAGNOSTIC BILATERAL MAMMOGRAM WITH TOMO AND CAD
6 of 10 series · 6 of 30 positions shown · non-contrast
Comparison: None.

CLINICAL DATA: 37-year-old female with focal left breast pain for
approximately 1 week and associated physician palpated lump. Family
history of breast cancer in her mother diagnosed at age 47.

EXAM:
DIGITAL DIAGNOSTIC BILATERAL MAMMOGRAM WITH CAD AND TOMO
ULTRASOUND LEFT BREAST

[L TAN synth-2D]
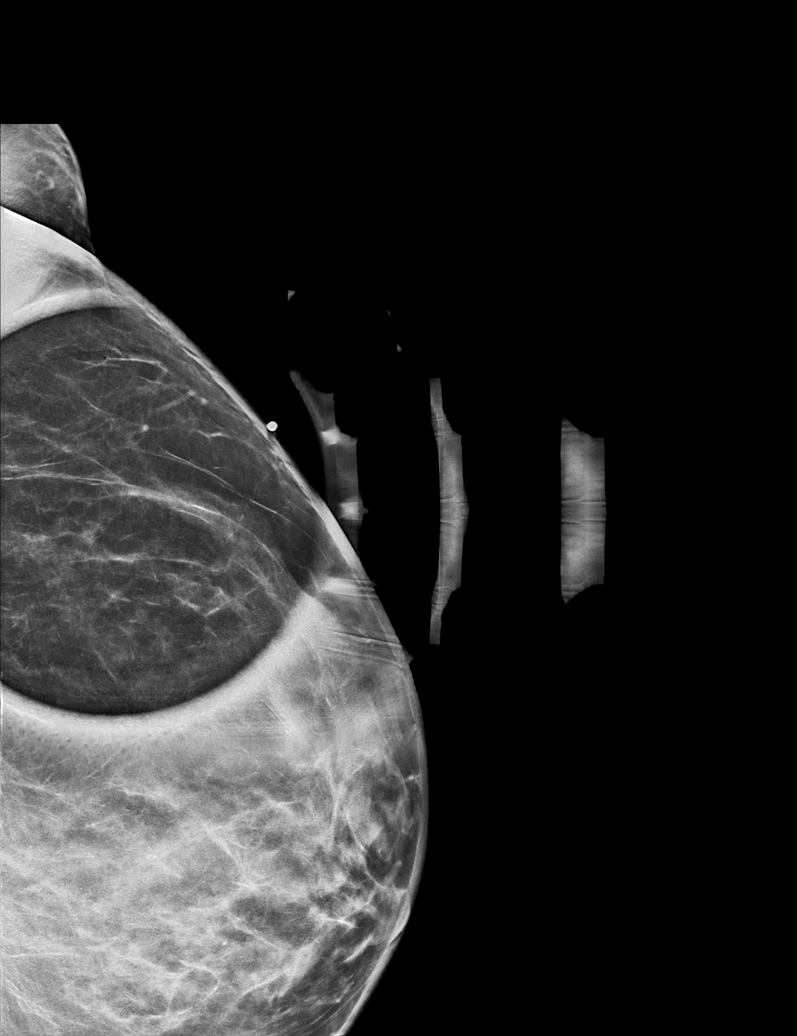

[R CC synth-2D]
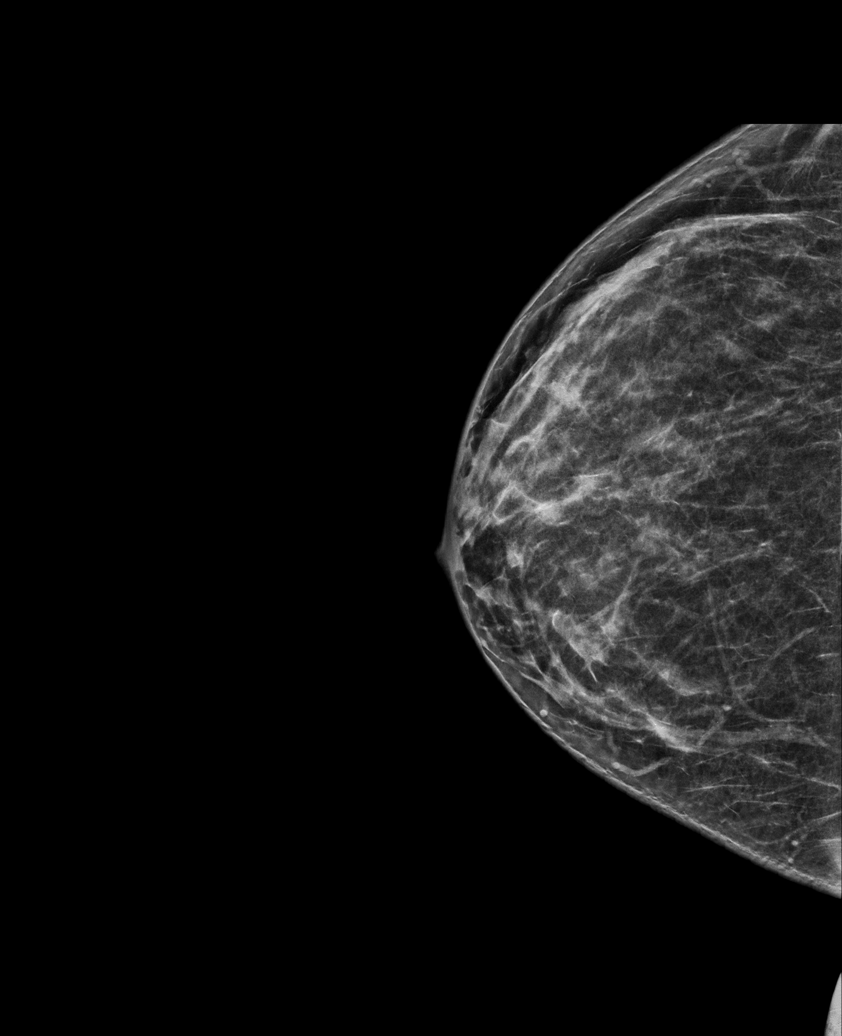

[R MLO synth-2D]
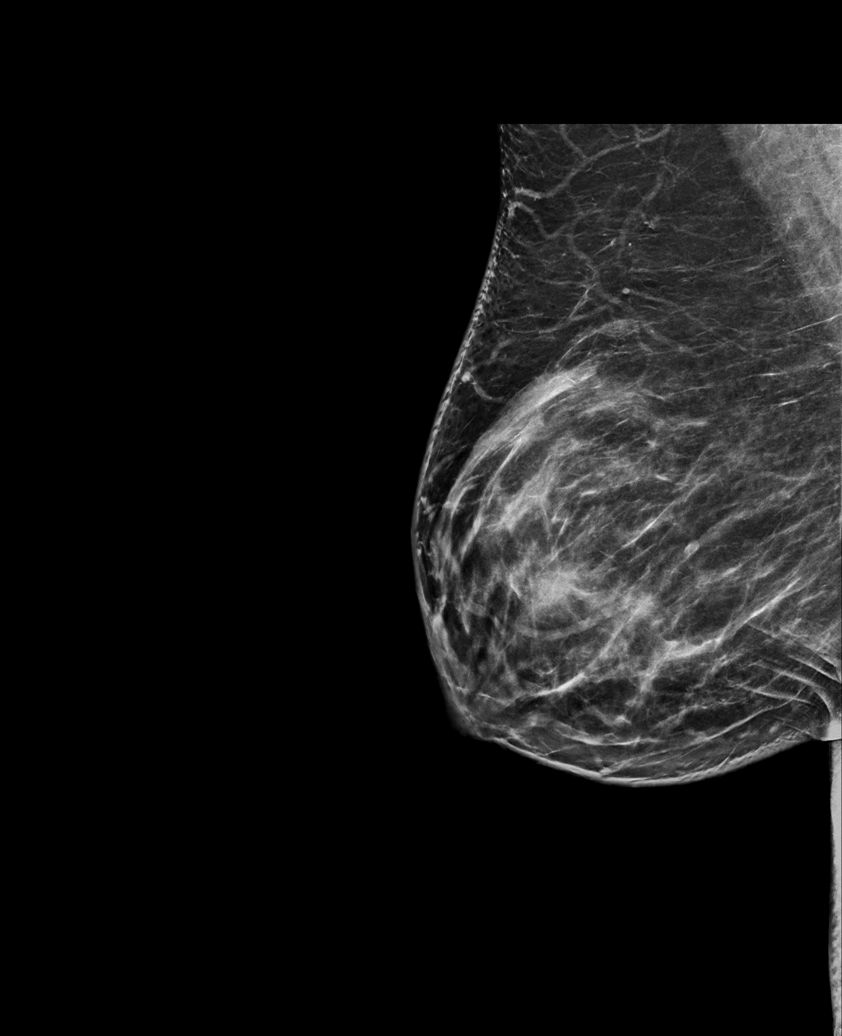

[L MLO synth-2D]
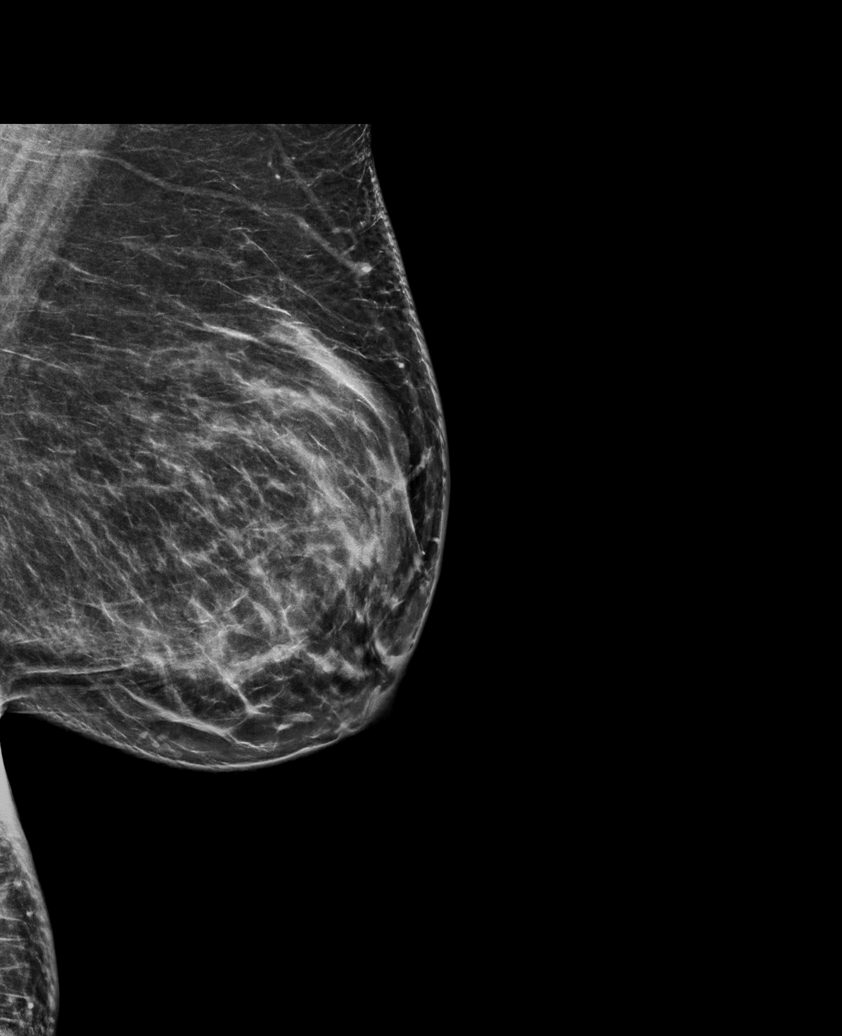

[L CC synth-2D]
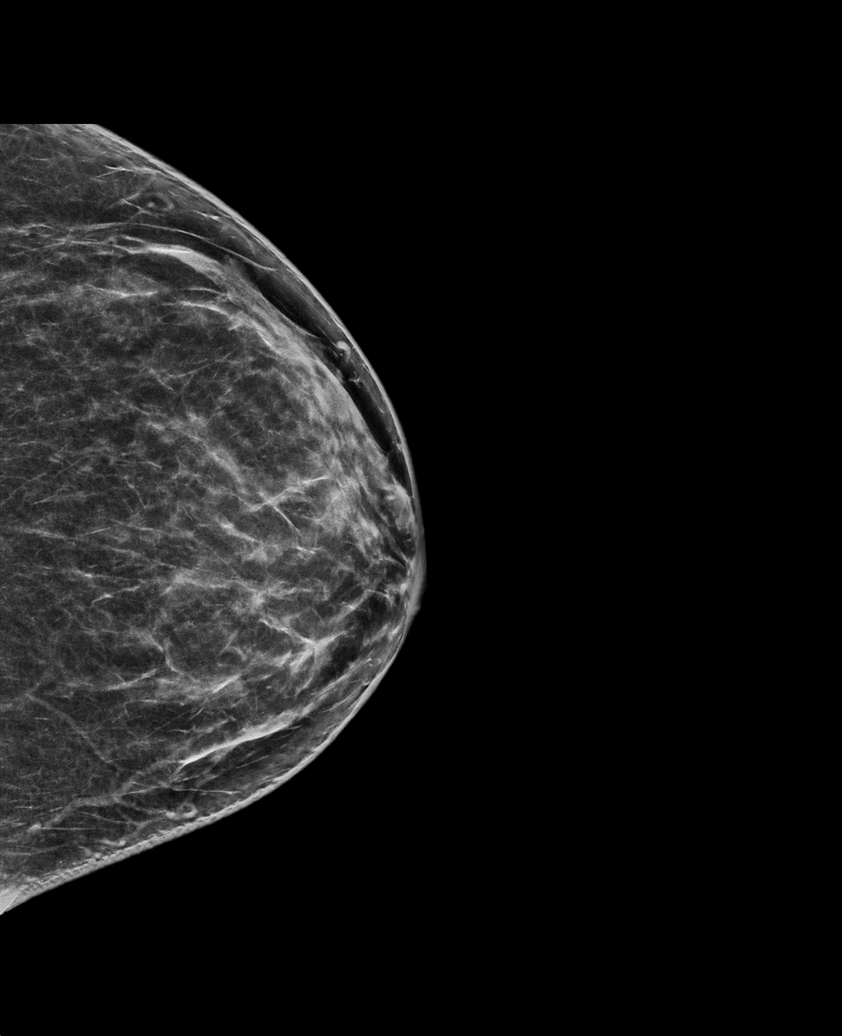

[L TAN tomo · tomo slice 32/63.0]
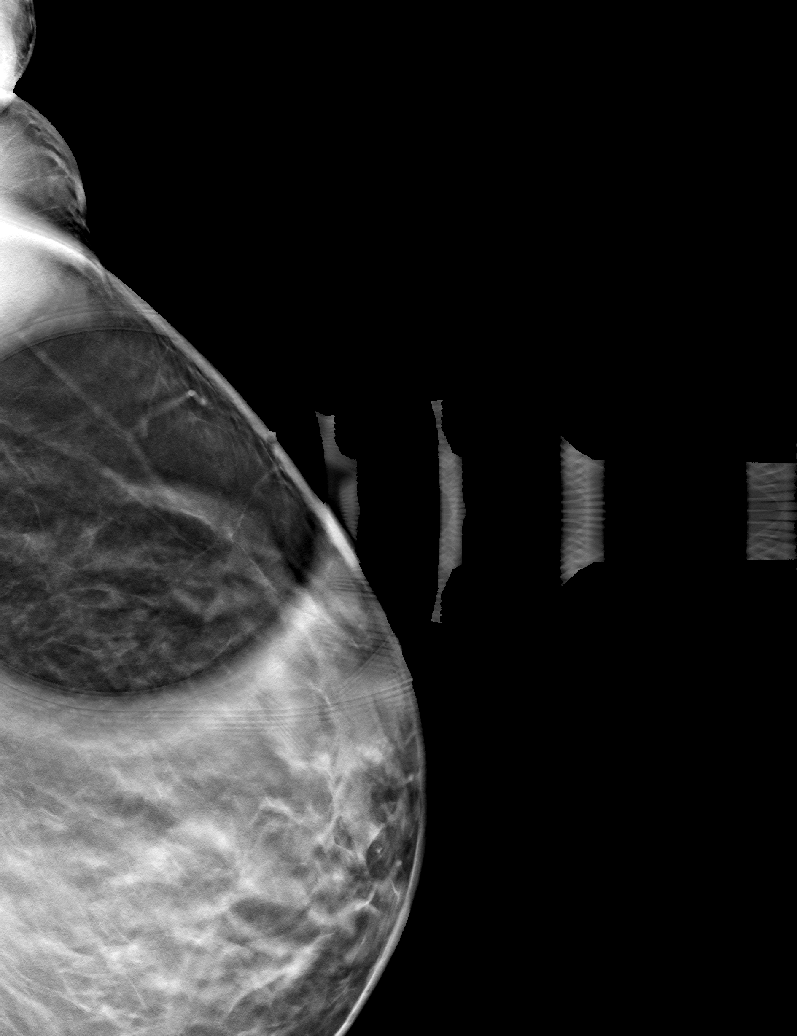

[6 of 30 positions shown; findings below may reference images not displayed]

ACR Breast Density Category c: The breast tissue is heterogeneously
dense, which may obscure small masses.
FINDINGS: A radiopaque BB was placed at the site of the patient's clinical
symptoms in the upper-outer quadrant of the left breast. A band of
fibroglandular tissue is seen deep to the radiopaque BB, with no
other suspicious findings. No mammographic evidence of malignancy in
the remainder of either breast.

Mammographic images were processed with CAD.

On physical exam, I palpate a band of dense glandular tissue, mobile
within the upper-outer quadrant of the left breast.

Targeted ultrasound is performed, showing normal fibroglandular
tissue without focal or suspicious sonographic abnormality. A band
of dense tissue is seen at the site of the patient's palpable lump.
No suspicious findings are identified. Evaluation of the entire
upper outer quadrant was performed.
IMPRESSION: 1. No suspicious findings corresponding with the patient's clinical
symptoms in the upper-outer quadrant of the left breast. Her
palpable lump likely corresponds with a band of dense fibroglandular
tissue.
2. No mammographic evidence of malignancy on the right.

RECOMMENDATION:
1. Clinical follow-up recommended for the symptomatic area of
concern in the left breast. Any further workup should be based on
clinical grounds.
2. Screening mammogram in one year.(Code:EJ-Y-K0U) Annual screening
mammography is recommended given the patient's family history of
breast cancer.

I have discussed the findings and recommendations with the patient.
Results were also provided in writing at the conclusion of the
visit. If applicable, a reminder letter will be sent to the patient
regarding the next appointment.

BI-RADS CATEGORY  1: Negative.

## 2019-05-16 IMAGING — US ULTRASOUND LEFT BREAST LIMITED
1 series · 1 of 1 positions shown · non-contrast
Comparison: None.

CLINICAL DATA: 37-year-old female with focal left breast pain for
approximately 1 week and associated physician palpated lump. Family
history of breast cancer in her mother diagnosed at age 47.

EXAM:
DIGITAL DIAGNOSTIC BILATERAL MAMMOGRAM WITH CAD AND TOMO
ULTRASOUND LEFT BREAST

[Series 1: ultrasound left breast limited · 0.07mm/px · 1 of 1 slices shown]
[im 1/1]
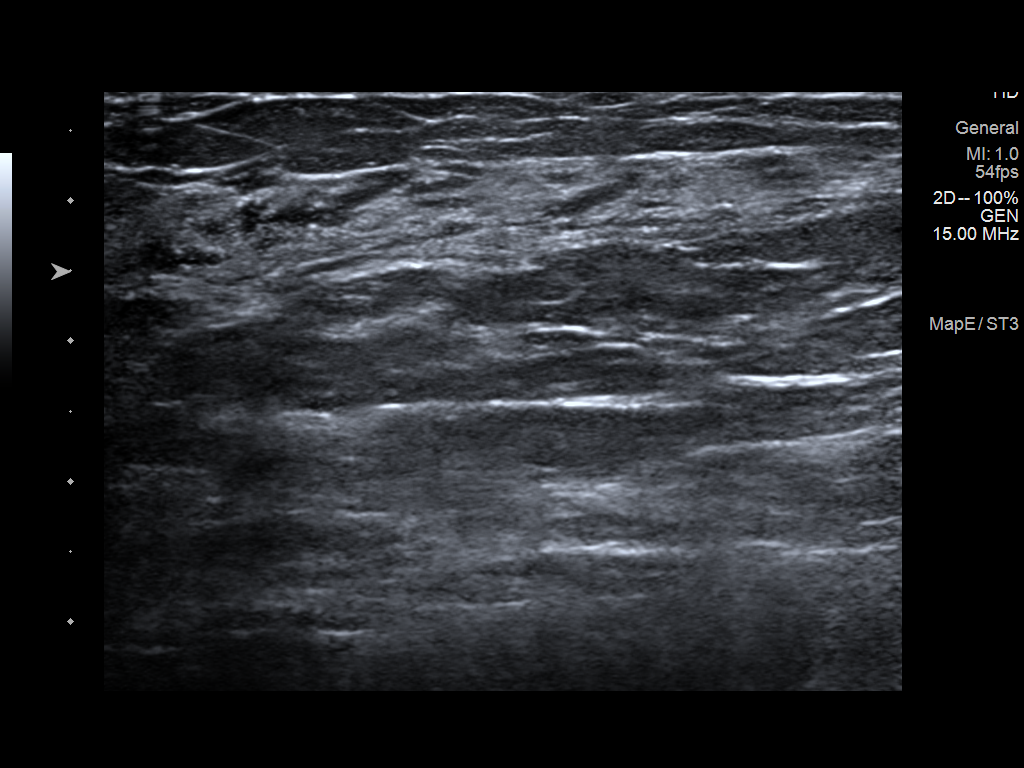

[1 of 1 positions shown; findings below may reference images not displayed]

ACR Breast Density Category c: The breast tissue is heterogeneously
dense, which may obscure small masses.
FINDINGS: A radiopaque BB was placed at the site of the patient's clinical
symptoms in the upper-outer quadrant of the left breast. A band of
fibroglandular tissue is seen deep to the radiopaque BB, with no
other suspicious findings. No mammographic evidence of malignancy in
the remainder of either breast.

Mammographic images were processed with CAD.

On physical exam, I palpate a band of dense glandular tissue, mobile
within the upper-outer quadrant of the left breast.

Targeted ultrasound is performed, showing normal fibroglandular
tissue without focal or suspicious sonographic abnormality. A band
of dense tissue is seen at the site of the patient's palpable lump.
No suspicious findings are identified. Evaluation of the entire
upper outer quadrant was performed.
IMPRESSION: 1. No suspicious findings corresponding with the patient's clinical
symptoms in the upper-outer quadrant of the left breast. Her
palpable lump likely corresponds with a band of dense fibroglandular
tissue.
2. No mammographic evidence of malignancy on the right.

RECOMMENDATION:
1. Clinical follow-up recommended for the symptomatic area of
concern in the left breast. Any further workup should be based on
clinical grounds.
2. Screening mammogram in one year.(Code:EJ-Y-K0U) Annual screening
mammography is recommended given the patient's family history of
breast cancer.

I have discussed the findings and recommendations with the patient.
Results were also provided in writing at the conclusion of the
visit. If applicable, a reminder letter will be sent to the patient
regarding the next appointment.

BI-RADS CATEGORY  1: Negative.

## 2019-05-30 NOTE — Progress Notes (Signed)
GYNECOLOGY  VISIT   HPI: 38 y.o.   Married Other or two or more races Hispanic or Latino  female   [phone] with Patient's last menstrual period was 04/04/2019 (exact date).   here for  Follow up on Nuvaring. States that she pulled her Nuvaring in April and had her menses. Did not replace her Nuvaring ring and has not had a menses since. Wants blood pregnancy test today. The patient has a h/o oligomenorrhea followed by heavy, prolonged bleeding leading to anemia. Bleeding previously controlled by the nuvaring. She lost the nuvaring box so didn't restart it.  Normal TSH and prolactin in 9/19.  She has a multicystic left ovary, due for f/u ultrasound next year.  She is on iron 1 x a day.  She has been having some pregnancy symptoms, knows she shouldn't be pregnant (h/o BTL), but desires blood pregnancy test.   GYNECOLOGIC HISTORY: Patient's last menstrual period was 04/04/2019 (exact date). Contraception:BTL Menopausal hormone therapy:None        OB History    Gravida  5   Para  4   Term  4   Preterm  0   AB  1   Living  0     SAB  1   TAB  0   Ectopic  0   Multiple  0   Live Births  0              Patient Active Problem List   Diagnosis Date Noted  . H/O tubal ligation 06/29/2018  . Anemia 06/29/2018  . Menometrorrhagia 06/29/2018    Past Medical History:  Diagnosis Date  . Abnormal Pap smear of cervix   . Abnormal uterine bleeding   . Anemia   . Anxiety   . Asthma    in the past  . Depression   . Medical history non-contributory   . Migraine without aura   . Ovarian cyst     Past Surgical History:  Procedure Laterality Date  . CESAREAN SECTION     times 2  . CESAREAN SECTION WITH BILATERAL TUBAL LIGATION    . COLPOSCOPY    . TUBAL LIGATION      Current Outpatient Medications  Medication Sig Dispense Refill  . Prenatal Vit-Fe Fumarate-FA (PRENATAL VITAMIN PO) Take by mouth.    . etonogestrel-ethinyl estradiol (NUVARING) 0.12-0.015 MG/24HR  vaginal ring Place 1 each vaginally every 28 (twenty-eight) days. Insert vaginally and leave in place for 3 consecutive weeks, then remove for 1 week. (Patient not taking: Reported on 06/01/2019) 3 each 0   No current facility-administered medications for this visit.      ALLERGIES: Bee venom and Codeine  Family History  Problem Relation Age of Onset  . Breast cancer Mother   . Hypertension Mother   . Diabetes Father   . Hypertension Father   . Stroke Father   . Heart attack Father   . Miscarriages / Stillbirths Neg Hx   . Obesity Neg Hx   . Vision loss Neg Hx   . Varicose Veins Neg Hx     Social History   Socioeconomic History  . Marital status: Married    Spouse name: Not on file  . Number of children: Not on file  . Years of education: Not on file  . Highest education level: Not on file  Occupational History  . Not on file  Social Needs  . Financial resource strain: Not on file  . Food insecurity    Worry:  Not on file    Inability: Not on file  . Transportation needs    Medical: Not on file    Non-medical: Not on file  Tobacco Use  . Smoking status: Never Smoker  . Smokeless tobacco: Never Used  Substance and Sexual Activity  . Alcohol use: No  . Drug use: No  . Sexual activity: Yes    Partners: Male    Birth control/protection: Other-see comments    Comment: BTL  Lifestyle  . Physical activity    Days per week: Not on file    Minutes per session: Not on file  . Stress: Not on file  Relationships  . Social Herbalist on phone: Not on file    Gets together: Not on file    Attends religious service: Not on file    Active member of club or organization: Not on file    Attends meetings of clubs or organizations: Not on file    Relationship status: Not on file  . Intimate partner violence    Fear of current or ex partner: Not on file    Emotionally abused: Not on file    Physically abused: Not on file    Forced sexual activity: Not on file   Other Topics Concern  . Not on file  Social History Narrative  . Not on file    Review of Systems  Constitutional: Negative.   HENT: Negative.   Eyes: Negative.   Respiratory: Negative.   Cardiovascular: Negative.   Gastrointestinal: Negative.   Genitourinary:       Amenorrhea  Musculoskeletal: Negative.   Skin: Negative.   Neurological: Negative.   Endo/Heme/Allergies: Negative.   Psychiatric/Behavioral: Negative.     PHYSICAL EXAMINATION:    BP 124/82 (BP Location: Right Arm, Patient Position: Sitting, Cuff Size: Large)   Pulse 88   Temp 98.4 F (36.9 C) (Skin)   Wt (!) 304 lb 9.6 oz (138.2 kg)   LMP 04/04/2019 (Exact Date)   BMI 51.88 kg/m     General appearance: alert, cooperative and appears stated age  ASSESSMENT Amenorrhea since stopping the ring 2 months ago, long h/o oligomenorrhea (prior normal labs). H/O BTL H/O borderline BP, BP is okay today but she isn't using the ring. H/O anemia    PLAN CBC, Ferritin, BhcG Provera 5 mg x 5 day (start once BhcG is negative), restart nuvaring with menses F/U in 3 months, want to recheck her BP on the ring.    An After Visit Summary was printed and given to the patient.

## 2019-06-01 ENCOUNTER — Encounter: Payer: Self-pay | Admitting: Obstetrics and Gynecology

## 2019-06-01 ENCOUNTER — Ambulatory Visit (INDEPENDENT_AMBULATORY_CARE_PROVIDER_SITE_OTHER): Payer: 59 | Admitting: Obstetrics and Gynecology

## 2019-06-01 ENCOUNTER — Other Ambulatory Visit: Payer: Self-pay

## 2019-06-01 ENCOUNTER — Other Ambulatory Visit: Payer: 59

## 2019-06-01 VITALS — BP 124/82 | HR 88 | Temp 98.4°F | Wt 304.6 lb

## 2019-06-01 DIAGNOSIS — Z862 Personal history of diseases of the blood and blood-forming organs and certain disorders involving the immune mechanism: Secondary | ICD-10-CM | POA: Diagnosis not present

## 2019-06-01 DIAGNOSIS — N912 Amenorrhea, unspecified: Secondary | ICD-10-CM | POA: Diagnosis not present

## 2019-06-01 MED ORDER — ETONOGESTREL-ETHINYL ESTRADIOL 0.12-0.015 MG/24HR VA RING: 1.0000 | VAGINAL_RING | VAGINAL | 0 refills | Status: DC

## 2019-06-01 MED ORDER — MEDROXYPROGESTERONE ACETATE 5 MG PO TABS: 5.0000 mg | ORAL_TABLET | Freq: Every day | ORAL | 0 refills | Status: DC

## 2019-06-02 LAB — CBC
Hematocrit: 35.2 % (ref 34.0–46.6)
Hemoglobin: 10.4 g/dL — ABNORMAL LOW (ref 11.1–15.9)
MCH: 21.3 pg — ABNORMAL LOW (ref 26.6–33.0)
MCHC: 29.5 g/dL — ABNORMAL LOW (ref 31.5–35.7)
MCV: 72 fL — ABNORMAL LOW (ref 79–97)
Platelets: 357 10*3/uL (ref 150–450)
RBC: 4.89 x10E6/uL (ref 3.77–5.28)
RDW: 17.1 % — ABNORMAL HIGH (ref 11.7–15.4)
WBC: 10 10*3/uL (ref 3.4–10.8)

## 2019-06-02 LAB — FERRITIN: Ferritin: 8 ng/mL — ABNORMAL LOW (ref 15–150)

## 2019-06-02 LAB — BETA HCG QUANT (REF LAB): hCG Quant: <1 m[IU]/mL

## 2019-07-20 ENCOUNTER — Other Ambulatory Visit: Payer: 59

## 2019-08-04 ENCOUNTER — Other Ambulatory Visit: Payer: 59

## 2019-08-04 ENCOUNTER — Other Ambulatory Visit: Payer: Self-pay | Admitting: *Deleted

## 2019-08-04 DIAGNOSIS — Z862 Personal history of diseases of the blood and blood-forming organs and certain disorders involving the immune mechanism: Secondary | ICD-10-CM

## 2019-08-30 NOTE — Progress Notes (Deleted)
GYNECOLOGY  VISIT   HPI: 38 y.o.   Married Other or two or more races Hispanic or Latino  female   [phone] with No LMP recorded.   here for 3 month recheck on Nuvaring.   She has a h/o oligomenorrhea followed by heavy, prolonged bleeding leading to anemia. Previously had normal cycles on the nuvaring. She restarted the nuvaring 3 months ago. H/O borderline HTN, normal BP at her last visit prior to starting the ring.  Last lab work from 6/20: hgb 10.4, Ferritin 8  GYNECOLOGIC HISTORY: No LMP recorded. Contraception:*** Menopausal hormone therapy: ***        OB History    Gravida  5   Para  4   Term  4   Preterm  0   AB  1   Living  0     SAB  1   TAB  0   Ectopic  0   Multiple  0   Live Births  0              Patient Active Problem List   Diagnosis Date Noted  . H/O tubal ligation 06/29/2018  . Anemia 06/29/2018  . Menometrorrhagia 06/29/2018    Past Medical History:  Diagnosis Date  . Abnormal Pap smear of cervix   . Abnormal uterine bleeding   . Anemia   . Anxiety   . Asthma    in the past  . Depression   . Medical history non-contributory   . Migraine without aura   . Ovarian cyst     Past Surgical History:  Procedure Laterality Date  . CESAREAN SECTION     times 2  . CESAREAN SECTION WITH BILATERAL TUBAL LIGATION    . COLPOSCOPY    . TUBAL LIGATION      Current Outpatient Medications  Medication Sig Dispense Refill  . etonogestrel-ethinyl estradiol (NUVARING) 0.12-0.015 MG/24HR vaginal ring Place 1 each vaginally every 28 (twenty-eight) days. Insert vaginally and leave in place for 3 consecutive weeks, then remove for 1 week. 3 each 0  . medroxyPROGESTERone (PROVERA) 5 MG tablet Take 1 tablet (5 mg total) by mouth daily. 5 tablet 0  . Prenatal Vit-Fe Fumarate-FA (PRENATAL VITAMIN PO) Take by mouth.     No current facility-administered medications for this visit.      ALLERGIES: Bee venom and Codeine  Family History  Problem  Relation Age of Onset  . Breast cancer Mother   . Hypertension Mother   . Diabetes Father   . Hypertension Father   . Stroke Father   . Heart attack Father   . Miscarriages / Stillbirths Neg Hx   . Obesity Neg Hx   . Vision loss Neg Hx   . Varicose Veins Neg Hx     Social History   Socioeconomic History  . Marital status: Married    Spouse name: Not on file  . Number of children: Not on file  . Years of education: Not on file  . Highest education level: Not on file  Occupational History  . Not on file  Social Needs  . Financial resource strain: Not on file  . Food insecurity    Worry: Not on file    Inability: Not on file  . Transportation needs    Medical: Not on file    Non-medical: Not on file  Tobacco Use  . Smoking status: Never Smoker  . Smokeless tobacco: Never Used  Substance and Sexual Activity  . Alcohol use:  No  . Drug use: No  . Sexual activity: Yes    Partners: Male    Birth control/protection: Other-see comments    Comment: BTL  Lifestyle  . Physical activity    Days per week: Not on file    Minutes per session: Not on file  . Stress: Not on file  Relationships  . Social Herbalist on phone: Not on file    Gets together: Not on file    Attends religious service: Not on file    Active member of club or organization: Not on file    Attends meetings of clubs or organizations: Not on file    Relationship status: Not on file  . Intimate partner violence    Fear of current or ex partner: Not on file    Emotionally abused: Not on file    Physically abused: Not on file    Forced sexual activity: Not on file  Other Topics Concern  . Not on file  Social History Narrative  . Not on file    ROS  PHYSICAL EXAMINATION:    There were no vitals taken for this visit.    General appearance: alert, cooperative and appears stated age Neck: no adenopathy, supple, symmetrical, trachea midline and thyroid {CHL AMB PHY EX THYROID NORM  DEFAULT:[phone]::"normal to inspection and palpation"} Breasts: {Exam; breast:13139::"normal appearance, no masses or tenderness"} Abdomen: soft, non-tender; non distended, no masses,  no organomegaly  Pelvic: External genitalia:  no lesions              Urethra:  normal appearing urethra with no masses, tenderness or lesions              Bartholins and Skenes: normal                 Vagina: normal appearing vagina with normal color and discharge, no lesions              Cervix: {CHL AMB PHY EX CERVIX NORM DEFAULT:[phone]::"no lesions"}              Bimanual Exam:  Uterus:  {CHL AMB PHY EX UTERUS NORM DEFAULT:[phone]::"normal size, contour, position, consistency, mobility, non-tender"}              Adnexa: {CHL AMB PHY EX ADNEXA NO MASS [phone]::"no mass, fullness, tenderness"}              Rectovaginal: {yes no:[id number]}.  Confirms.              Anus:  normal sphincter tone, no lesions  Chaperone was present for exam.  ASSESSMENT H/O oligomenorrhea, followed by abnormal bleeding.  H/O anemia    PLAN    An After Visit Summary was printed and given to the patient.  *** minutes face to face time of which over 50% was spent in counseling.

## 2019-08-31 ENCOUNTER — Ambulatory Visit: Payer: 59 | Admitting: Obstetrics and Gynecology

## 2019-08-31 ENCOUNTER — Encounter: Payer: Self-pay | Admitting: Obstetrics and Gynecology

## 2020-02-29 ENCOUNTER — Encounter: Payer: Self-pay | Admitting: Certified Nurse Midwife

## 2020-05-31 NOTE — Progress Notes (Signed)
39 y.o. [id number] Married, together but living separately.  Other or two or more races Hispanic or Latino female here for annual exam.   The patient has a h/o secondary oligomenorrhea, then extended bleeding and iron def anemia. She was diagnosed with a multicystic left ovarian cyst in 2019 and is due for f/u.  In the last year her cycles have been monthly and tolerable.  She gets nausea before her cycle, recent intermenstrual spotting (just this month). She desires pregnancy test. Sexually active, no pain. No libido.  Period Cycle (Days): 28 Period Duration (Days): 4 Period Pattern: Regular Menstrual Flow: Moderate Menstrual Control: Maxi pad Menstrual Control Change Freq (Hours): changes pad every 2-3 hours Dysmenorrhea: (!) Moderate Dysmenorrhea Symptoms: Nausea, Cramping  Patient's last menstrual period was 05/10/2020.          Sexually active: Yes.    The current method of family planning is tubal ligation.    Exercising: Yes.    treadmill, stationary bike Smoker:  no  Health Maintenance: Pap:  09-03-18 negative, HR HPV negative  History of abnormal Pap:  Yes, f/u negative MMG:  09-08-18 density C/BIRADS 1 negative  BMD:   none Colonoscopy: none  TDaP:  03-08-13  Gardasil: no   reports that she has never smoked. She has never used smokeless tobacco. She reports that she does not drink alcohol and does not use drugs. 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 Kids are 18, 17, 15 and 14. She has 2 dogs.    Past Medical History:  Diagnosis Date  . Abnormal Pap smear of cervix   . Abnormal uterine bleeding   . Anemia   . Anxiety   . Asthma    in the past  . Depression   . Medical history non-contributory   . Migraine without aura   . Ovarian cyst     Past Surgical History:  Procedure Laterality Date  . CESAREAN SECTION     times 2  . CESAREAN SECTION WITH BILATERAL TUBAL LIGATION    . COLPOSCOPY    . TUBAL LIGATION      No current  outpatient medications on file.   No current facility-administered medications for this visit.    Family History  Problem Relation Age of Onset  . Breast cancer Mother   . Hypertension Mother   . Diabetes Father   . Hypertension Father   . Stroke Father   . Heart attack Father   . Miscarriages / Stillbirths Neg Hx   . Obesity Neg Hx   . Vision loss Neg Hx   . Varicose Veins Neg Hx     Review of Systems  Constitutional: Negative.   HENT: Negative.   Eyes: Negative.   Respiratory: Negative.   Cardiovascular: Negative.   Gastrointestinal: Negative.   Endocrine: Negative.   Genitourinary: Negative.   Musculoskeletal: Negative.   Allergic/Immunologic: Negative.   Neurological: Negative.   Hematological: Negative.   Psychiatric/Behavioral: Negative.     Exam:   BP (!) 158/98 (BP Location: Right Arm, Patient Position: Sitting, Cuff Size: Large)   Pulse 86   Temp (!) 97.5 F (36.4 C) (Skin)   Resp 16   Ht 5' 4.25" (1.632 m)   Wt 295 lb (133.8 kg)   LMP 05/10/2020   BMI 50.24 kg/m   Weight change: @WEIGHTCHANGE @ Height:   Height: 5' 4.25" (163.2 cm)  Ht Readings from Last 3 Encounters:  06/01/20 5' 4.25" (1.632 m)  09/03/18 5' 4.25" (  1.632 m)  04/22/18 5\' 5"  (1.651 m)    General appearance: alert, cooperative and appears stated age Head: Normocephalic, without obvious abnormality, atraumatic Neck: no adenopathy, supple, symmetrical, trachea midline and thyroid normal to inspection and palpation Lungs: clear to auscultation bilaterally Cardiovascular: regular rate and rhythm Breasts: normal appearance, no masses or tenderness Abdomen: soft, non-tender; non distended,  no masses,  no organomegaly Extremities: extremities normal, atraumatic, no cyanosis or edema Skin: Skin color, texture, turgor normal. No rashes or lesions Lymph nodes: Cervical, supraclavicular, and axillary nodes normal. No abnormal inguinal nodes palpated Neurologic: Grossly normal   Pelvic:  External genitalia:  no lesions              Urethra:  normal appearing urethra with no masses, tenderness or lesions              Bartholins and Skenes: normal                 Vagina: normal appearing vagina with normal color and discharge, no lesions              Cervix: no lesions               Bimanual Exam:  Uterus:  no masses or tenderness              Adnexa: no mass, fullness, tenderness               Rectovaginal: Confirms               Anus:  normal sphincter tone, no lesions  Karmen Bongo chaperoned for the exam.  A:  Well Woman with normal exam  Under stress, recommended she speak with a counselor  BMI 50  Recent intermenstrual spotting, nausea with her cycle, desires UPT  Low libido, discussed multifactorial etiologies  H/O anemia  H/O Left ovarian cyst, return for f/u ultrasound    P:   Screening labs, STD  Pregnancy test  Vit D  HgbA1C, TSH, ferritin  No pap this year  Discussed breast self exam  Discussed calcium and vit D intake  Information on libido given  Start gardasil series

## 2020-06-01 ENCOUNTER — Other Ambulatory Visit: Payer: Self-pay

## 2020-06-01 ENCOUNTER — Ambulatory Visit (INDEPENDENT_AMBULATORY_CARE_PROVIDER_SITE_OTHER): Payer: 59 | Admitting: Obstetrics and Gynecology

## 2020-06-01 ENCOUNTER — Encounter: Payer: Self-pay | Admitting: Obstetrics and Gynecology

## 2020-06-01 VITALS — BP 158/98 | HR 86 | Temp 97.5°F | Resp 16 | Ht 64.25 in | Wt 295.0 lb

## 2020-06-01 DIAGNOSIS — Z23 Encounter for immunization: Secondary | ICD-10-CM | POA: Diagnosis not present

## 2020-06-01 DIAGNOSIS — N83202 Unspecified ovarian cyst, left side: Secondary | ICD-10-CM | POA: Diagnosis not present

## 2020-06-01 DIAGNOSIS — Z6841 Body Mass Index (BMI) 40.0 and over, adult: Secondary | ICD-10-CM

## 2020-06-01 DIAGNOSIS — Z0000 Encounter for general adult medical examination without abnormal findings: Secondary | ICD-10-CM

## 2020-06-01 DIAGNOSIS — Z01419 Encounter for gynecological examination (general) (routine) without abnormal findings: Secondary | ICD-10-CM | POA: Diagnosis not present

## 2020-06-01 DIAGNOSIS — Z862 Personal history of diseases of the blood and blood-forming organs and certain disorders involving the immune mechanism: Secondary | ICD-10-CM

## 2020-06-01 DIAGNOSIS — Z113 Encounter for screening for infections with a predominantly sexual mode of transmission: Secondary | ICD-10-CM

## 2020-06-01 DIAGNOSIS — N923 Ovulation bleeding: Secondary | ICD-10-CM

## 2020-06-01 DIAGNOSIS — E559 Vitamin D deficiency, unspecified: Secondary | ICD-10-CM | POA: Diagnosis not present

## 2020-06-01 DIAGNOSIS — R110 Nausea: Secondary | ICD-10-CM

## 2020-06-01 NOTE — Addendum Note (Signed)
Addended by: Karmen Bongo T on: 06/01/2020 09:16 AM   Modules accepted: Orders

## 2020-06-01 NOTE — Patient Instructions (Signed)

## 2020-06-02 LAB — CBC
Hematocrit: 34.4 % (ref 34.0–46.6)
Hemoglobin: 9.5 g/dL — ABNORMAL LOW (ref 11.1–15.9)
MCH: 19.2 pg — ABNORMAL LOW (ref 26.6–33.0)
MCHC: 27.6 g/dL — ABNORMAL LOW (ref 31.5–35.7)
MCV: 70 fL — ABNORMAL LOW (ref 79–97)
Platelets: 349 10*3/uL (ref 150–450)
RBC: 4.94 x10E6/uL (ref 3.77–5.28)
RDW: 17.8 % — ABNORMAL HIGH (ref 11.7–15.4)
WBC: 7.7 10*3/uL (ref 3.4–10.8)

## 2020-06-02 LAB — COMPREHENSIVE METABOLIC PANEL
ALT: 9 IU/L (ref 0–32)
AST: 10 IU/L (ref 0–40)
Albumin/Globulin Ratio: 1.5 (ref 1.2–2.2)
Albumin: 4.1 g/dL (ref 3.8–4.8)
Alkaline Phosphatase: 80 IU/L (ref 48–121)
BUN/Creatinine Ratio: 17 (ref 9–23)
BUN: 14 mg/dL (ref 6–20)
Bilirubin Total: <0.2 mg/dL (ref 0.0–1.2)
CO2: 24 mmol/L (ref 20–29)
Calcium: 8.8 mg/dL (ref 8.7–10.2)
Chloride: 103 mmol/L (ref 96–106)
Creatinine, Ser: 0.83 mg/dL (ref 0.57–1.00)
GFR calc Af Amer: 103 mL/min/{1.73_m2} (ref >59)
GFR calc non Af Amer: 89 mL/min/{1.73_m2} (ref >59)
Globulin, Total: 2.8 g/dL (ref 1.5–4.5)
Glucose: 95 mg/dL (ref 65–99)
Potassium: 4.4 mmol/L (ref 3.5–5.2)
Sodium: 138 mmol/L (ref 134–144)
Total Protein: 6.9 g/dL (ref 6.0–8.5)

## 2020-06-02 LAB — HEP, RPR, HIV PANEL
HIV Screen 4th Generation wRfx: NONREACTIVE
Hepatitis B Surface Ag: NEGATIVE
RPR Ser Ql: NONREACTIVE

## 2020-06-02 LAB — LIPID PANEL
Chol/HDL Ratio: 3.7 ratio (ref 0.0–4.4)
Cholesterol, Total: 154 mg/dL (ref 100–199)
HDL: 42 mg/dL (ref >39)
LDL Chol Calc (NIH): 94 mg/dL (ref 0–99)
Triglycerides: 96 mg/dL (ref 0–149)
VLDL Cholesterol Cal: 18 mg/dL (ref 5–40)

## 2020-06-02 LAB — HSV(HERPES SIMPLEX VRS) I + II AB-IGG
HSV 1 Glycoprotein G Ab, IgG: 4.02 index — ABNORMAL HIGH (ref 0.00–0.90)
HSV 2 IgG, Type Spec: 18.5 index — ABNORMAL HIGH (ref 0.00–0.90)

## 2020-06-02 LAB — HEPATITIS C ANTIBODY: Hep C Virus Ab: <0.1 s/co ratio (ref 0.0–0.9)

## 2020-06-02 LAB — TSH: TSH: 2.79 u[IU]/mL (ref 0.450–4.500)

## 2020-06-02 LAB — BETA HCG QUANT (REF LAB): hCG Quant: <1 m[IU]/mL

## 2020-06-02 LAB — FERRITIN: Ferritin: 7 ng/mL — ABNORMAL LOW (ref 15–150)

## 2020-06-02 LAB — HEMOGLOBIN A1C
Est. average glucose Bld gHb Est-mCnc: 117 mg/dL
Hgb A1c MFr Bld: 5.7 % — ABNORMAL HIGH (ref 4.8–5.6)

## 2020-06-02 LAB — VITAMIN D 25 HYDROXY (VIT D DEFICIENCY, FRACTURES): Vit D, 25-Hydroxy: 20.8 ng/mL — ABNORMAL LOW (ref 30.0–100.0)

## 2020-06-04 LAB — CHLAMYDIA/GONOCOCCUS/TRICHOMONAS, NAA
Chlamydia by NAA: NEGATIVE
Gonococcus by NAA: NEGATIVE
Trich vag by NAA: NEGATIVE

## 2020-06-21 ENCOUNTER — Encounter: Payer: Self-pay | Admitting: Obstetrics and Gynecology

## 2020-06-21 ENCOUNTER — Other Ambulatory Visit: Payer: Self-pay

## 2020-06-21 ENCOUNTER — Ambulatory Visit (INDEPENDENT_AMBULATORY_CARE_PROVIDER_SITE_OTHER): Payer: 59

## 2020-06-21 ENCOUNTER — Ambulatory Visit (INDEPENDENT_AMBULATORY_CARE_PROVIDER_SITE_OTHER): Payer: 59 | Admitting: Obstetrics and Gynecology

## 2020-06-21 VITALS — BP 122/74 | HR 88 | Ht 64.25 in | Wt 295.0 lb

## 2020-06-21 DIAGNOSIS — N946 Dysmenorrhea, unspecified: Secondary | ICD-10-CM | POA: Diagnosis not present

## 2020-06-21 DIAGNOSIS — N83202 Unspecified ovarian cyst, left side: Secondary | ICD-10-CM | POA: Diagnosis not present

## 2020-06-21 DIAGNOSIS — Z8742 Personal history of other diseases of the female genital tract: Secondary | ICD-10-CM | POA: Diagnosis not present

## 2020-06-21 DIAGNOSIS — N920 Excessive and frequent menstruation with regular cycle: Secondary | ICD-10-CM | POA: Diagnosis not present

## 2020-06-21 DIAGNOSIS — D508 Other iron deficiency anemias: Secondary | ICD-10-CM | POA: Diagnosis not present

## 2020-06-21 MED ORDER — IBUPROFEN 800 MG PO TABS
800.0000 mg | ORAL_TABLET | Freq: Three times a day (TID) | ORAL | 1 refills | Status: DC | PRN
Start: 2020-06-21 — End: 2020-09-07

## 2020-06-21 MED ORDER — NORETHIN ACE-ETH ESTRAD-FE 1-20 MG-MCG PO TABS: 1.0000 | ORAL_TABLET | Freq: Every day | ORAL | 0 refills | Status: DC

## 2020-06-21 NOTE — Progress Notes (Signed)
GYNECOLOGY  VISIT   HPI: 39 y.o.   Married Other or two or more races Hispanic or Latino  female   [phone] with Patient's last menstrual period was 06/02/2020.   here for a follow up ultrasound. She has been followed with a multicystic left ovary since 10/19. In 3/20 the left ovary had 3 cysts, 3.6 cm, 2.7 cm and 2.6 cm.    She has monthly cycles, bleeds x 4 days. Can change a pad in up to 1.5 hours. Her hgb from last month was 9.5 gm/dl. She reports severe dysmenorrhea. Tubal ligation for contraception.  Her BP was elevated at her last visit, no h/o hypertension.   GYNECOLOGIC HISTORY: Patient's last menstrual period was 06/02/2020. Contraception:tubal ligation Menopausal hormone therapy: none        OB History    Gravida  5   Para  4   Term  4   Preterm  0   AB  1   Living  0     SAB  1   TAB  0   Ectopic  0   Multiple  0   Live Births  0              Patient Active Problem List   Diagnosis Date Noted  . H/O tubal ligation 06/29/2018  . Anemia 06/29/2018  . Menometrorrhagia 06/29/2018    Past Medical History:  Diagnosis Date  . Abnormal Pap smear of cervix   . Abnormal uterine bleeding   . Anemia   . Anxiety   . Asthma    in the past  . Depression   . Medical history non-contributory   . Migraine without aura   . Ovarian cyst     Past Surgical History:  Procedure Laterality Date  . CESAREAN SECTION     times 2  . CESAREAN SECTION WITH BILATERAL TUBAL LIGATION    . COLPOSCOPY    . TUBAL LIGATION      No current outpatient medications on file.   No current facility-administered medications for this visit.     ALLERGIES: Bee venom and Codeine  Family History  Problem Relation Age of Onset  . Breast cancer Mother   . Hypertension Mother   . Diabetes Father   . Hypertension Father   . Stroke Father   . Heart attack Father   . Miscarriages / Stillbirths Neg Hx   . Obesity Neg Hx   . Vision loss Neg Hx   . Varicose Veins Neg Hx      Social History   Socioeconomic History  . Marital status: Married    Spouse name: Not on file  . Number of children: Not on file  . Years of education: Not on file  . Highest education level: Not on file  Occupational History  . Not on file  Tobacco Use  . Smoking status: Never Smoker  . Smokeless tobacco: Never Used  Vaping Use  . Vaping Use: Never used  Substance and Sexual Activity  . Alcohol use: No  . Drug use: No  . Sexual activity: Yes    Partners: Male    Birth control/protection: Other-see comments    Comment: BTL  Other Topics Concern  . Not on file  Social History Narrative  . Not on file   Social Determinants of Health   Financial Resource Strain:   . Difficulty of Paying Living Expenses:   Food Insecurity:   . Worried About Charity fundraiser in the  Last Year:   . [place] in the Last Year:   Transportation Needs:   . Film/video editor (Medical):   Marland Kitchen Lack of Transportation (Non-Medical):   Physical Activity:   . Days of Exercise per Week:   . Minutes of Exercise per Session:   Stress:   . Feeling of Stress :   Social Connections:   . Frequency of Communication with Friends and Family:   . Frequency of Social Gatherings with Friends and Family:   . Attends Religious Services:   . Active Member of Clubs or Organizations:   . Attends Archivist Meetings:   Marland Kitchen Marital Status:   Intimate Partner Violence:   . Fear of Current or Ex-Partner:   . Emotionally Abused:   Marland Kitchen Physically Abused:   . Sexually Abused:     ROS  PHYSICAL EXAMINATION:    BP 122/74   Pulse 88   Ht 5' 4.25" (1.632 m)   Wt 295 lb (133.8 kg)   LMP 06/02/2020   SpO2 99%   BMI 50.24 kg/m     General appearance: alert, cooperative and appears stated age  Ultrasound images reviewed with the patient. Ovarian cyst resolved, 3 small, stable intramural myomas, normal endometrial stripe.   ASSESSMENT H/O ovarian cyst, resolved The patient was noted to  be anemic at her annual exam last month. At that time she reported saturating a pad in 2-3 hours, today she says she can saturate a pad in up to 1.5 hours.  Severe dysmenorrhea Anemia  Elevated BP at her last visit, no h/o HTN, normal BP today (review of BP's over the last year are normal except last visit she reports being stressed that day)    PLAN No f/u ultrasound needed.  She hasn't started oral iron, will plan to start. Discussed the option of iron transfusion, patient desires, referral placed Start Ibuprofen for cramps Trial of OCP's for heavy flow and cramps. No contraindications, risks reviewed.    In addition to reviewing the ultrasound, over 25 minutes was spent in total patient care the main focus which was on treatment of her menorrhagia, dysmenorrhea and anemia.

## 2020-06-21 NOTE — Patient Instructions (Signed)

## 2020-06-26 ENCOUNTER — Telehealth: Payer: Self-pay | Admitting: Family

## 2020-06-26 NOTE — Telephone Encounter (Signed)
Patient advised of Appointments scheduled.  New Patient packet with calendar/letter & mailed 

## 2020-07-16 ENCOUNTER — Other Ambulatory Visit: Payer: Self-pay | Admitting: Family

## 2020-07-16 DIAGNOSIS — D649 Anemia, unspecified: Secondary | ICD-10-CM

## 2020-07-17 ENCOUNTER — Inpatient Hospital Stay: Payer: 59 | Attending: Family

## 2020-07-17 ENCOUNTER — Telehealth: Payer: Self-pay | Admitting: Family

## 2020-07-17 ENCOUNTER — Inpatient Hospital Stay (HOSPITAL_BASED_OUTPATIENT_CLINIC_OR_DEPARTMENT_OTHER): Payer: 59 | Admitting: Family

## 2020-07-17 ENCOUNTER — Other Ambulatory Visit: Payer: Self-pay

## 2020-07-17 ENCOUNTER — Other Ambulatory Visit: Payer: Self-pay | Admitting: Family

## 2020-07-17 ENCOUNTER — Encounter: Payer: Self-pay | Admitting: Family

## 2020-07-17 VITALS — BP 132/88 | HR 83 | Temp 98.3°F | Resp 18 | Ht 64.5 in | Wt 294.0 lb

## 2020-07-17 DIAGNOSIS — D500 Iron deficiency anemia secondary to blood loss (chronic): Secondary | ICD-10-CM

## 2020-07-17 DIAGNOSIS — Z793 Long term (current) use of hormonal contraceptives: Secondary | ICD-10-CM | POA: Insufficient documentation

## 2020-07-17 DIAGNOSIS — D509 Iron deficiency anemia, unspecified: Secondary | ICD-10-CM | POA: Insufficient documentation

## 2020-07-17 DIAGNOSIS — D649 Anemia, unspecified: Secondary | ICD-10-CM

## 2020-07-17 DIAGNOSIS — N920 Excessive and frequent menstruation with regular cycle: Secondary | ICD-10-CM | POA: Insufficient documentation

## 2020-07-17 LAB — CMP (CANCER CENTER ONLY)
ALT: 7 U/L (ref 0–44)
AST: 8 U/L — ABNORMAL LOW (ref 15–41)
Albumin: 3.8 g/dL (ref 3.5–5.0)
Alkaline Phosphatase: 63 U/L (ref 38–126)
Anion gap: 8 (ref 5–15)
BUN: 12 mg/dL (ref 6–20)
CO2: 27 mmol/L (ref 22–32)
Calcium: 8.9 mg/dL (ref 8.9–10.3)
Chloride: 103 mmol/L (ref 98–111)
Creatinine: 0.82 mg/dL (ref 0.44–1.00)
GFR, Est AFR Am: >60 mL/min (ref >60)
GFR, Estimated: >60 mL/min (ref >60)
Glucose, Bld: 101 mg/dL — ABNORMAL HIGH (ref 70–99)
Potassium: 4.1 mmol/L (ref 3.5–5.1)
Sodium: 138 mmol/L (ref 135–145)
Total Bilirubin: 0.3 mg/dL (ref 0.3–1.2)
Total Protein: 6.8 g/dL (ref 6.5–8.1)

## 2020-07-17 LAB — RETICULOCYTES
Immature Retic Fract: 17 % — ABNORMAL HIGH (ref 2.3–15.9)
RBC.: 4.72 MIL/uL (ref 3.87–5.11)
Retic Count, Absolute: 74.6 10*3/uL (ref 19.0–186.0)
Retic Ct Pct: 1.6 % (ref 0.4–3.1)

## 2020-07-17 LAB — CBC WITH DIFFERENTIAL (CANCER CENTER ONLY)
Abs Immature Granulocytes: 0.03 10*3/uL (ref 0.00–0.07)
Basophils Absolute: 0 10*3/uL (ref 0.0–0.1)
Basophils Relative: 0 %
Eosinophils Absolute: 0.1 10*3/uL (ref 0.0–0.5)
Eosinophils Relative: 1 %
HCT: 33.5 % — ABNORMAL LOW (ref 36.0–46.0)
Hemoglobin: 9.7 g/dL — ABNORMAL LOW (ref 12.0–15.0)
Immature Granulocytes: 0 %
Lymphocytes Relative: 20 %
Lymphs Abs: 1.8 10*3/uL (ref 0.7–4.0)
MCH: 20.4 pg — ABNORMAL LOW (ref 26.0–34.0)
MCHC: 29 g/dL — ABNORMAL LOW (ref 30.0–36.0)
MCV: 70.5 fL — ABNORMAL LOW (ref 80.0–100.0)
Monocytes Absolute: 0.6 10*3/uL (ref 0.1–1.0)
Monocytes Relative: 7 %
Neutro Abs: 6.6 10*3/uL (ref 1.7–7.7)
Neutrophils Relative %: 72 %
Platelet Count: 347 10*3/uL (ref 150–400)
RBC: 4.75 MIL/uL (ref 3.87–5.11)
RDW: 19.5 % — ABNORMAL HIGH (ref 11.5–15.5)
WBC Count: 9.2 10*3/uL (ref 4.0–10.5)
nRBC: 0 % (ref 0.0–0.2)

## 2020-07-17 LAB — FERRITIN: Ferritin: 5 ng/mL — ABNORMAL LOW (ref 11–307)

## 2020-07-17 LAB — IRON AND TIBC
Iron: 20 ug/dL — ABNORMAL LOW (ref 41–142)
Saturation Ratios: 4 % — ABNORMAL LOW (ref 21–57)
TIBC: 438 ug/dL (ref 236–444)
UIBC: 418 ug/dL — ABNORMAL HIGH (ref 120–384)

## 2020-07-17 LAB — LACTATE DEHYDROGENASE: LDH: 156 U/L (ref 98–192)

## 2020-07-17 LAB — SAVE SMEAR(SSMR), FOR PROVIDER SLIDE REVIEW

## 2020-07-17 NOTE — Telephone Encounter (Signed)
I called and spoke with patient regarding Venofer infusions x 5.  I have scheduled 2 appointments and she Will schedule other infusions at her appt on 8/12

## 2020-07-17 NOTE — Progress Notes (Signed)
Hematology/Oncology Consultation   Name: Terri Walker      MRN: [id number]    Location: Room/bed info not found  Date: 07/17/2020 Time:9:38 AM   REFERRING PHYSICIAN: Dorothy Spark, MD  REASON FOR CONSULT: Iron deficiency secondary to heavy cycles   DIAGNOSIS: Iron deficiency secondary to heavy cycles   HISTORY OF PRESENT ILLNESS: Terri Walker is a very pleasant 39 yo [place] female with history of iron deficiency anemia secondary to heavy cycles.  She states that she has noted that her recent cycles have lasted up to 3 weeks.  Ferritin in June was 7. Hgb today is stable at 9.7.  She has not noted any other blood loss. No bruising or petechiae.  She has 3 cysts on the left ovary and states that she also has history of uterine fibroids. She states that she has stopped the Loestrin FE as she felt it made her cycle heavier. She also states that she failed Progestin as well.  She has never received blood or Iv iron before.  She failed oral iron due to GI upset and constipation.  She is symptomatic with fatigue, SOB with exertion, chills, occasional SOB with over exertion and increased headaches.  Her mother, father and daughter also have history of anemia.  No personal history of cancer. Mother had past history of breast cancer.  She had a mammogram in 09/2018 which was negative.  She has 4 children and has history of 2 miscarriages.  No sickle cell disease or trait.  She is prediabetic but not on treatment. No fever, n/v, cough, rash, chest pain, palpitations, abdominal pain or changes in bowel or bladder habits.  No swelling, tenderness, numbness or tingling in her extremities.  No falls or syncope.  No smoking or ETOH. She occasionally smoke cannabis to relax.   She has maintained a good appetite and is staying well hydrated. Her weight is stable.  She is originally from [place] and moved to [place] with her husband and children.  She stays busy with her family and  working from home for [employer].   ROS: All other 10 point review of systems is negative.   PAST MEDICAL HISTORY:   Past Medical History:  Diagnosis Date  . Abnormal Pap smear of cervix   . Abnormal uterine bleeding   . Anemia   . Anxiety   . Asthma    in the past  . Depression   . Medical history non-contributory   . Migraine without aura   . Ovarian cyst     ALLERGIES: Allergies  Allergen Reactions  . Bee Venom Anaphylaxis  . Codeine Hives and Shortness Of Breath      MEDICATIONS:  Current Outpatient Medications on File Prior to Visit  Medication Sig Dispense Refill  . ibuprofen (ADVIL) 800 MG tablet Take 1 tablet (800 mg total) by mouth every 8 (eight) hours as needed. 30 tablet 1  . norethindrone-ethinyl estradiol (LOESTRIN FE) 1-20 MG-MCG tablet Take 1 tablet by mouth daily. 84 tablet 0   No current facility-administered medications on file prior to visit.     PAST SURGICAL HISTORY Past Surgical History:  Procedure Laterality Date  . CESAREAN SECTION     times 2  . CESAREAN SECTION WITH BILATERAL TUBAL LIGATION    . COLPOSCOPY    . TUBAL LIGATION      FAMILY HISTORY: Family History  Problem Relation Age of Onset  . Breast cancer Mother   . Hypertension Mother   .  Diabetes Father   . Hypertension Father   . Stroke Father   . Heart attack Father   . Miscarriages / Stillbirths Neg Hx   . Obesity Neg Hx   . Vision loss Neg Hx   . Varicose Veins Neg Hx     SOCIAL HISTORY:  reports that she quit smoking about 2 years ago. Her smoking use included cigarettes. She smoked 0.50 packs per day. She has never used smokeless tobacco. She reports that she does not drink alcohol and does not use drugs.  PERFORMANCE STATUS: The patient's performance status is 1 - Symptomatic but completely ambulatory  PHYSICAL EXAM: Most Recent Vital Signs: Blood pressure 132/88, pulse 83, temperature 98.3 F (36.8 C), temperature source Oral, resp. rate 18, height 5'  4.5" (1.638 m), weight 294 lb (133.4 kg), SpO2 100 %. BP 132/88 (BP Location: Left Wrist, Patient Position: Sitting)   Pulse 83   Temp 98.3 F (36.8 C) (Oral)   Resp 18   Ht 5' 4.5" (1.638 m)   Wt 294 lb (133.4 kg)   SpO2 100%   BMI 49.69 kg/m   General Appearance:    Alert, cooperative, no distress, appears stated age  Head:    Normocephalic, without obvious abnormality, atraumatic  Eyes:    PERRL, conjunctiva/corneas clear, EOM's intact, fundi    benign, both eyes        Throat:   Lips, mucosa, and tongue normal; teeth and gums normal  Neck:   Supple, symmetrical, trachea midline, no adenopathy;    thyroid:  no enlargement/tenderness/nodules; no carotid   bruit or JVD  Back:     Symmetric, no curvature, ROM normal, no CVA tenderness  Lungs:     Clear to auscultation bilaterally, respirations unlabored  Chest Wall:    No tenderness or deformity   Heart:    Regular rate and rhythm, S1 and S2 normal, no murmur, rub   or gallop     Abdomen:     Soft, non-tender, bowel sounds active all four quadrants,    no masses, no organomegaly        Extremities:   Extremities normal, atraumatic, no cyanosis or edema  Pulses:   2+ and symmetric all extremities  Skin:   Skin color, texture, turgor normal, no rashes or lesions  Lymph nodes:   Cervical, supraclavicular, and axillary nodes normal  Neurologic:   CNII-XII intact, normal strength, sensation and reflexes    throughout    LABORATORY DATA:  Results for orders placed or performed in visit on 07/17/20 (from the past 48 hour(s))  CBC with Differential (Cancer Center Only)     Status: Abnormal   Collection Time: 07/17/20  8:22 AM  Result Value Ref Range   WBC Count 9.2 4.0 - 10.5 K/uL   RBC 4.75 3.87 - 5.11 MIL/uL   Hemoglobin 9.7 (L) 12.0 - 15.0 g/dL    Comment: Reticulocyte Hemoglobin testing may be clinically indicated, consider ordering this additional test [id number]    HCT 33.5 (L) 36 - 46 %   MCV 70.5 (L) 80.0 - 100.0 fL    MCH 20.4 (L) 26.0 - 34.0 pg   MCHC 29.0 (L) 30.0 - 36.0 g/dL   RDW 19.5 (H) 11.5 - 15.5 %   Platelet Count 347 150 - 400 K/uL   nRBC 0.0 0.0 - 0.2 %   Neutrophils Relative % 72 %   Neutro Abs 6.6 1.7 - 7.7 K/uL   Lymphocytes Relative 20 %   Lymphs  Abs 1.8 0.7 - 4.0 K/uL   Monocytes Relative 7 %   Monocytes Absolute 0.6 0 - 1 K/uL   Eosinophils Relative 1 %   Eosinophils Absolute 0.1 0 - 0 K/uL   Basophils Relative 0 %   Basophils Absolute 0.0 0 - 0 K/uL   Immature Granulocytes 0 %   Abs Immature Granulocytes 0.03 0.00 - 0.07 K/uL    Comment: Performed at [facility] at [facility], [address] [place], [place] [address]  CMP ([facility] only)     Status: Abnormal   Collection Time: 07/17/20  8:22 AM  Result Value Ref Range   Sodium 138 135 - 145 mmol/L   Potassium 4.1 3.5 - 5.1 mmol/L   Chloride 103 98 - 111 mmol/L   CO2 27 22 - 32 mmol/L   Glucose, Bld 101 (H) 70 - 99 mg/dL    Comment: Glucose reference range applies only to samples taken after fasting for at least 8 hours.   BUN 12 6 - 20 mg/dL   Creatinine 0.82 0.44 - 1.00 mg/dL   Calcium 8.9 8.9 - 10.3 mg/dL   Total Protein 6.8 6.5 - 8.1 g/dL   Albumin 3.8 3.5 - 5.0 g/dL   AST 8 (L) 15 - 41 U/L   ALT 7 0 - 44 U/L   Alkaline Phosphatase 63 38 - 126 U/L   Total Bilirubin 0.3 0.3 - 1.2 mg/dL   GFR, Est Non Af Am >60 >60 mL/min   GFR, Est AFR Am >60 >60 mL/min   Anion gap 8 5 - 15    Comment: Performed at [facility] at [facility], [address], [place], [place] [address]  Save Smear [facility])     Status: None   Collection Time: 07/17/20  8:22 AM  Result Value Ref Range   Smear Review SMEAR STAINED AND AVAILABLE FOR REVIEW     Comment: Performed at [facility] at [facility], [address], [place], [place] [address]  Reticulocytes     Status: Abnormal   Collection Time: 07/17/20  8:23 AM  Result Value Ref Range   Retic Ct  Pct 1.6 0.4 - 3.1 %   RBC. 4.72 3.87 - 5.11 MIL/uL   Retic Count, Absolute 74.6 19.0 - 186.0 K/uL   Immature Retic Fract 17.0 (H) 2.3 - 15.9 %    Comment: Performed at [facility] Lab at [facility], [address] [place], [place] [address]      RADIOGRAPHY: No results found.     PATHOLOGY: None  ASSESSMENT/PLAN: Ms. Birkel is a very pleasant 39 yo [place] female with history of iron deficiency anemia secondary to heavy cycles.  Hgb is stable at 9.7 and MCV down at 70.  She is symptomatic as mentioned above.  She is followed regularly by gynecology for the heavy cycles but so far has not had a good response on birth control.  Once we have her lab results we will determine a plan of care and schedule her follow-up for in 8 weeks.   All questions were answered and she is in agreement with the plan. She can contact our office with any questions or concerns. We can certainly see her sooner if needed.   [name], NP

## 2020-07-17 NOTE — Telephone Encounter (Signed)
Appointments scheduled calendar printed & mailed per 8/10 los

## 2020-07-18 LAB — ERYTHROPOIETIN: Erythropoietin: 43.1 m[IU]/mL — ABNORMAL HIGH (ref 2.6–18.5)

## 2020-07-19 ENCOUNTER — Other Ambulatory Visit: Payer: Self-pay

## 2020-07-19 ENCOUNTER — Inpatient Hospital Stay: Payer: 59

## 2020-07-19 VITALS — BP 153/77 | HR 87 | Temp 99.3°F | Resp 18

## 2020-07-19 DIAGNOSIS — D500 Iron deficiency anemia secondary to blood loss (chronic): Secondary | ICD-10-CM

## 2020-07-19 DIAGNOSIS — N921 Excessive and frequent menstruation with irregular cycle: Secondary | ICD-10-CM

## 2020-07-19 LAB — HGB FRACTIONATION CASCADE
Hgb A2: 2.1 % (ref 1.8–3.2)
Hgb A: 97.9 % (ref 96.4–98.8)
Hgb F: 0 % (ref 0.0–2.0)
Hgb S: 0 %

## 2020-07-19 MED ORDER — SODIUM CHLORIDE 0.9 % IV SOLN
200.0000 mg | Freq: Once | INTRAVENOUS | Status: AC
  Administered 2020-07-19: 200 mg via INTRAVENOUS
  Filled 2020-07-19: qty 10

## 2020-07-19 MED ORDER — SODIUM CHLORIDE 0.9 % IV SOLN
Freq: Once | INTRAVENOUS | Status: AC
  Filled 2020-07-19: qty 250

## 2020-07-19 NOTE — Patient Instructions (Signed)

## 2020-07-23 ENCOUNTER — Inpatient Hospital Stay: Payer: 59

## 2020-07-23 ENCOUNTER — Other Ambulatory Visit: Payer: Self-pay

## 2020-07-23 VITALS — BP 143/79 | HR 64 | Temp 98.6°F | Resp 19

## 2020-07-23 DIAGNOSIS — D500 Iron deficiency anemia secondary to blood loss (chronic): Secondary | ICD-10-CM

## 2020-07-23 DIAGNOSIS — N921 Excessive and frequent menstruation with irregular cycle: Secondary | ICD-10-CM

## 2020-07-23 MED ORDER — SODIUM CHLORIDE 0.9 % IV SOLN
Freq: Once | INTRAVENOUS | Status: AC
  Filled 2020-07-23: qty 250

## 2020-07-23 MED ORDER — KETOROLAC TROMETHAMINE 15 MG/ML IJ SOLN
INTRAMUSCULAR | Status: AC
  Filled 2020-07-23: qty 1

## 2020-07-23 MED ORDER — KETOROLAC TROMETHAMINE 15 MG/ML IJ SOLN
15.0000 mg | Freq: Once | INTRAMUSCULAR | Status: AC
  Administered 2020-07-23: 15 mg via INTRAVENOUS

## 2020-07-23 MED ORDER — SODIUM CHLORIDE 0.9 % IV SOLN
200.0000 mg | Freq: Once | INTRAVENOUS | Status: AC
  Administered 2020-07-23: 200 mg via INTRAVENOUS
  Filled 2020-07-23: qty 200

## 2020-07-23 NOTE — Progress Notes (Signed)
Pt c/o " bad headache" after completion of iron infusion and 38min observation. Discussed with MD, VO for Toradol 15mg  x1 now.

## 2020-07-24 LAB — ALPHA-THALASSEMIA GENOTYPR

## 2020-07-27 ENCOUNTER — Inpatient Hospital Stay: Payer: 59

## 2020-07-27 VITALS — BP 136/99 | HR 74 | Temp 98.4°F | Resp 18

## 2020-07-27 DIAGNOSIS — D500 Iron deficiency anemia secondary to blood loss (chronic): Secondary | ICD-10-CM | POA: Diagnosis not present

## 2020-07-27 DIAGNOSIS — N921 Excessive and frequent menstruation with irregular cycle: Secondary | ICD-10-CM

## 2020-07-27 MED ORDER — SODIUM CHLORIDE 0.9 % IV SOLN
200.0000 mg | Freq: Once | INTRAVENOUS | Status: AC
  Administered 2020-07-27: 200 mg via INTRAVENOUS
  Filled 2020-07-27: qty 200

## 2020-07-27 MED ORDER — SODIUM CHLORIDE 0.9 % IV SOLN
Freq: Once | INTRAVENOUS | Status: AC
  Filled 2020-07-27: qty 250

## 2020-07-27 NOTE — Patient Instructions (Signed)

## 2020-08-02 ENCOUNTER — Inpatient Hospital Stay: Payer: 59

## 2020-08-02 ENCOUNTER — Other Ambulatory Visit: Payer: Self-pay

## 2020-08-02 VITALS — BP 161/93 | HR 71 | Temp 99.3°F | Resp 16

## 2020-08-02 DIAGNOSIS — N921 Excessive and frequent menstruation with irregular cycle: Secondary | ICD-10-CM

## 2020-08-02 DIAGNOSIS — D500 Iron deficiency anemia secondary to blood loss (chronic): Secondary | ICD-10-CM | POA: Diagnosis not present

## 2020-08-02 MED ORDER — SODIUM CHLORIDE 0.9 % IV SOLN
Freq: Once | INTRAVENOUS | Status: AC
  Filled 2020-08-02: qty 250

## 2020-08-02 MED ORDER — SODIUM CHLORIDE 0.9 % IV SOLN
200.0000 mg | Freq: Once | INTRAVENOUS | Status: AC
  Administered 2020-08-02: 200 mg via INTRAVENOUS
  Filled 2020-08-02: qty 200

## 2020-08-02 NOTE — Patient Instructions (Signed)

## 2020-08-03 ENCOUNTER — Inpatient Hospital Stay: Payer: 59

## 2020-08-03 ENCOUNTER — Telehealth: Payer: Self-pay | Admitting: Obstetrics and Gynecology

## 2020-08-03 VITALS — BP 142/61 | HR 81 | Temp 98.8°F | Resp 19

## 2020-08-03 DIAGNOSIS — D500 Iron deficiency anemia secondary to blood loss (chronic): Secondary | ICD-10-CM

## 2020-08-03 DIAGNOSIS — N921 Excessive and frequent menstruation with irregular cycle: Secondary | ICD-10-CM

## 2020-08-03 MED ORDER — SODIUM CHLORIDE 0.9 % IV SOLN
Freq: Once | INTRAVENOUS | Status: AC
  Filled 2020-08-03: qty 250

## 2020-08-03 MED ORDER — SODIUM CHLORIDE 0.9 % IV SOLN
200.0000 mg | Freq: Once | INTRAVENOUS | Status: AC
  Administered 2020-08-03: 200 mg via INTRAVENOUS
  Filled 2020-08-03: qty 200

## 2020-08-03 NOTE — Patient Instructions (Signed)

## 2020-08-03 NOTE — Telephone Encounter (Signed)
Patient would like to know what is her next step after iron infusions.

## 2020-08-03 NOTE — Telephone Encounter (Signed)
Spoke with pt. Pt states having 5th and final iron infusion today due to iron def. Anemia per S. Paisley, NP. Pt asking when to have follow up. Pt advised per reviewed notes, to call [facility] to have follow up in 8 weeks. Pt verbalized understanding.   Pt also states having no cycle start in last 3 weeks. Pt states last LMP 7/24-30/21. Pt states was normal cycle. Pt states no pregnancy chance due to tubal ligation. Advised pt to continue to monitor for cycle and return call to office if no cycle in 1-2 weeks. Advised no cycle can be caused by her anemia. Pt verbalized understanding.   Pt advised will give update to Dr [name] and return call with any further recommendations. Pt agreeable.   Routing to Dr [name]  Pt has 3 month follow up with Dr [name] scheduled for 09/26/20 at 1130 am.

## 2020-08-03 NOTE — Progress Notes (Signed)
Pt. Refused to wait 30 min post infusion. Released stable and ASX. 

## 2020-08-04 NOTE — Telephone Encounter (Signed)
She has a h/o oligomenorrhea in the past. If she gets to 8 weeks without a cycle I would treat her. I don't think her not having a cycle is correlated with her anemia.

## 2020-08-06 NOTE — Telephone Encounter (Signed)
Spoke with pt. Pt given update per Dr [name]. Pt agreeable and will monitor cycle until 8 weeks without and then call and give update to Dr [name] for treatment at this time. Pt verbalized understanding.   Encounter closed

## 2020-08-19 ENCOUNTER — Emergency Department (HOSPITAL_BASED_OUTPATIENT_CLINIC_OR_DEPARTMENT_OTHER)
Admission: EM | Admit: 2020-08-19 | Discharge: 2020-08-19 | Disposition: A | Discharge status: Home / Self Care | Payer: 59 | Attending: Emergency Medicine | Admitting: Emergency Medicine

## 2020-08-19 ENCOUNTER — Encounter (HOSPITAL_BASED_OUTPATIENT_CLINIC_OR_DEPARTMENT_OTHER): Payer: Self-pay | Admitting: Emergency Medicine

## 2020-08-19 ENCOUNTER — Other Ambulatory Visit: Payer: Self-pay

## 2020-08-19 DIAGNOSIS — R102 Pelvic and perineal pain: Secondary | ICD-10-CM | POA: Diagnosis not present

## 2020-08-19 DIAGNOSIS — M545 Low back pain: Secondary | ICD-10-CM | POA: Diagnosis not present

## 2020-08-19 DIAGNOSIS — S39012A Strain of muscle, fascia and tendon of lower back, initial encounter: Secondary | ICD-10-CM

## 2020-08-19 DIAGNOSIS — Z79899 Other long term (current) drug therapy: Secondary | ICD-10-CM | POA: Diagnosis not present

## 2020-08-19 DIAGNOSIS — N8110 Cystocele, unspecified: Secondary | ICD-10-CM

## 2020-08-19 DIAGNOSIS — J45909 Unspecified asthma, uncomplicated: Secondary | ICD-10-CM | POA: Insufficient documentation

## 2020-08-19 DIAGNOSIS — Z87891 Personal history of nicotine dependence: Secondary | ICD-10-CM | POA: Insufficient documentation

## 2020-08-19 LAB — CBC WITH DIFFERENTIAL/PLATELET
Abs Immature Granulocytes: 0.05 10*3/uL (ref 0.00–0.07)
Basophils Absolute: 0 10*3/uL (ref 0.0–0.1)
Basophils Relative: 0 %
Eosinophils Absolute: 0.2 10*3/uL (ref 0.0–0.5)
Eosinophils Relative: 2 %
HCT: 36.8 % (ref 36.0–46.0)
Hemoglobin: 10.9 g/dL — ABNORMAL LOW (ref 12.0–15.0)
Immature Granulocytes: 1 %
Lymphocytes Relative: 23 %
Lymphs Abs: 2.5 10*3/uL (ref 0.7–4.0)
MCH: 23 pg — ABNORMAL LOW (ref 26.0–34.0)
MCHC: 29.6 g/dL — ABNORMAL LOW (ref 30.0–36.0)
MCV: 77.6 fL — ABNORMAL LOW (ref 80.0–100.0)
Monocytes Absolute: 0.4 10*3/uL (ref 0.1–1.0)
Monocytes Relative: 4 %
Neutro Abs: 7.8 10*3/uL — ABNORMAL HIGH (ref 1.7–7.7)
Neutrophils Relative %: 70 %
Platelets: 279 10*3/uL (ref 150–400)
RBC: 4.74 MIL/uL (ref 3.87–5.11)
RDW: 25.7 % — ABNORMAL HIGH (ref 11.5–15.5)
WBC: 11 10*3/uL — ABNORMAL HIGH (ref 4.0–10.5)
nRBC: 0 % (ref 0.0–0.2)

## 2020-08-19 LAB — COMPREHENSIVE METABOLIC PANEL
ALT: 12 U/L (ref 0–44)
AST: 13 U/L — ABNORMAL LOW (ref 15–41)
Albumin: 3.4 g/dL — ABNORMAL LOW (ref 3.5–5.0)
Alkaline Phosphatase: 52 U/L (ref 38–126)
Anion gap: 9 (ref 5–15)
BUN: 15 mg/dL (ref 6–20)
CO2: 24 mmol/L (ref 22–32)
Calcium: 8.6 mg/dL — ABNORMAL LOW (ref 8.9–10.3)
Chloride: 104 mmol/L (ref 98–111)
Creatinine, Ser: 0.85 mg/dL (ref 0.44–1.00)
GFR calc Af Amer: >60 mL/min (ref >60)
GFR calc non Af Amer: >60 mL/min (ref >60)
Glucose, Bld: 121 mg/dL — ABNORMAL HIGH (ref 70–99)
Potassium: 3.7 mmol/L (ref 3.5–5.1)
Sodium: 137 mmol/L (ref 135–145)
Total Bilirubin: <0.1 mg/dL — ABNORMAL LOW (ref 0.3–1.2)
Total Protein: 7.1 g/dL (ref 6.5–8.1)

## 2020-08-19 MED ORDER — SODIUM CHLORIDE 0.9 % IV BOLUS
1000.0000 mL | Freq: Once | INTRAVENOUS | Status: AC
  Administered 2020-08-19: 1000 mL via INTRAVENOUS

## 2020-08-19 MED ORDER — ONDANSETRON HCL 4 MG/2ML IJ SOLN
4.0000 mg | Freq: Once | INTRAMUSCULAR | Status: AC
  Administered 2020-08-19: 4 mg via INTRAVENOUS
  Filled 2020-08-19: qty 2

## 2020-08-19 MED ORDER — DIAZEPAM 5 MG PO TABS: 5.0000 mg | ORAL_TABLET | Freq: Three times a day (TID) | ORAL | 0 refills | Status: DC | PRN

## 2020-08-19 MED ORDER — DIAZEPAM 5 MG/ML IJ SOLN
5.0000 mg | Freq: Once | INTRAMUSCULAR | Status: AC
  Administered 2020-08-19: 5 mg via INTRAVENOUS
  Filled 2020-08-19: qty 2

## 2020-08-19 MED ORDER — FENTANYL CITRATE (PF) 100 MCG/2ML IJ SOLN
50.0000 ug | Freq: Once | INTRAMUSCULAR | Status: AC
  Administered 2020-08-19: 50 ug via INTRAVENOUS
  Filled 2020-08-19: qty 2

## 2020-08-19 NOTE — ED Triage Notes (Signed)
Reports coughing after smoking on Friday.  Felt like her bladder dropped.  Now having vaginal pain and reports she pulled something in her upper back.  Now having spasms in back.

## 2020-08-19 NOTE — Discharge Instructions (Signed)
You have back strain and bladder prolapse   Take motrin for pain   Take valium for spasms   See your GYN doctor   Return to ER if you have worse back pain, unable to urinate

## 2020-08-19 NOTE — ED Provider Notes (Signed)
[facility] Provider Note   CSN: [id number] Arrival date & time: 08/19/20  1621     History Chief Complaint  Patient presents with  . Vaginal Pain    Terri Walker is a 39 y.o. female history of asthma, ovarian cyst here presenting with constellation of symptoms.  Patient states that smoking marijuana on Friday.  She states that she had a coughing spell and then she felt like she had a bladder prolapse.  She states that she also have pulled some muscles in her upper back and has started having back spasms.  She denies any leg pain or numbness in her legs or trouble urinating.  Patient states that she has been having vaginal bleeding for the last several weeks  The history is provided by the patient.       Past Medical History:  Diagnosis Date  . Abnormal Pap smear of cervix   . Abnormal uterine bleeding   . Anemia   . Anxiety   . Asthma    in the past  . Depression   . Medical history non-contributory   . Migraine without aura   . Ovarian cyst     Patient Active Problem List   Diagnosis Date Noted  . IDA (iron deficiency anemia) 07/17/2020  . H/O tubal ligation 06/29/2018  . Anemia 06/29/2018  . Menometrorrhagia 06/29/2018    Past Surgical History:  Procedure Laterality Date  . CESAREAN SECTION     times 2  . CESAREAN SECTION WITH BILATERAL TUBAL LIGATION    . COLPOSCOPY    . TUBAL LIGATION       OB History    Gravida  5   Para  4   Term  4   Preterm  0   AB  1   Living  0     SAB  1   TAB  0   Ectopic  0   Multiple  0   Live Births  0           Family History  Problem Relation Age of Onset  . Breast cancer Mother   . Hypertension Mother   . Diabetes Father   . Hypertension Father   . Stroke Father   . Heart attack Father   . Miscarriages / Stillbirths Neg Hx   . Obesity Neg Hx   . Vision loss Neg Hx   . Varicose Veins Neg Hx     Social History   Tobacco Use  . Smoking status:  Former Smoker    Packs/day: 0.50    Types: Cigarettes    Quit date: 07/17/2018    Years since quitting: 2.0  . Smokeless tobacco: Never Used  Vaping Use  . Vaping Use: Never used  Substance Use Topics  . Alcohol use: No  . Drug use: No    Home Medications Prior to Admission medications   Medication Sig Start Date End Date Taking? Authorizing Provider  ibuprofen (ADVIL) 800 MG tablet Take 1 tablet (800 mg total) by mouth every 8 (eight) hours as needed. 06/21/20   Salvadore Dom, MD  norethindrone-ethinyl estradiol (LOESTRIN FE) 1-20 MG-MCG tablet Take 1 tablet by mouth daily. 06/21/20   Salvadore Dom, MD    Allergies    Bee venom and Codeine  Review of Systems   Review of Systems  Genitourinary: Positive for vaginal pain.  Musculoskeletal: Positive for back pain.  All other systems reviewed and are negative.   Physical Exam Updated  Vital Signs BP (!) 145/89 (BP Location: Right Arm)   Pulse 91   Temp 99 F (37.2 C) (Oral)   Resp 20   Ht 5\' 4"  (1.626 m)   Wt 131.5 kg   SpO2 98%   BMI 49.78 kg/m   Physical Exam Vitals and nursing note reviewed.  Constitutional:      Comments: Anxious and uncomfortable  HENT:     Head: Normocephalic.     Nose: Nose normal.     Mouth/Throat:     Mouth: Mucous membranes are moist.  Eyes:     Extraocular Movements: Extraocular movements intact.     Pupils: Pupils are equal, round, and reactive to light.  Cardiovascular:     Rate and Rhythm: Normal rate and regular rhythm.     Pulses: Normal pulses.     Heart sounds: Normal heart sounds.  Pulmonary:     Effort: Pulmonary effort is normal.     Breath sounds: Normal breath sounds.  Abdominal:     General: Abdomen is flat.     Palpations: Abdomen is soft.  Genitourinary:    Comments: Refused pelvic exam Musculoskeletal:     Cervical back: Normal range of motion.     Comments: Left scapula and trapezius tenderness with no obvious deformity.  Diffuse left paralumbar  tenderness.  No saddle anesthesia negative straight leg raise.  Skin:    General: Skin is warm.     Capillary Refill: Capillary refill takes less than 2 seconds.  Psychiatric:        Behavior: Behavior normal.     ED Results / Procedures / Treatments   Labs (all labs ordered are listed, but only abnormal results are displayed) Labs Reviewed  CBC WITH DIFFERENTIAL/PLATELET - Abnormal; Notable for the following components:      Result Value   WBC 11.0 (*)    Hemoglobin 10.9 (*)    MCV 77.6 (*)    MCH 23.0 (*)    MCHC 29.6 (*)    RDW 25.7 (*)    All other components within normal limits  COMPREHENSIVE METABOLIC PANEL  URINALYSIS, ROUTINE W REFLEX MICROSCOPIC  PREGNANCY, URINE  RAPID URINE DRUG SCREEN, HOSP PERFORMED    EKG None  Radiology No results found.  Procedures Procedures (including critical care time)  Medications Ordered in ED Medications  sodium chloride 0.9 % bolus 1,000 mL (1,000 mLs Intravenous New Bag/Given 08/19/20 2031)  fentaNYL (SUBLIMAZE) injection 50 mcg (50 mcg Intravenous Given 08/19/20 2031)  diazepam (VALIUM) injection 5 mg (5 mg Intravenous Given 08/19/20 2034)  ondansetron (ZOFRAN) injection 4 mg (4 mg Intravenous Given 08/19/20 2043)    ED Course  I have reviewed the triage vital signs and the nursing notes.  Pertinent labs & imaging results that were available during my care of the patient were reviewed by me and considered in my medical decision making (see chart for details).    MDM Rules/Calculators/A&P                         Terri Walker is a 39 y.o. female who presented with back pain and possible bladder prolapse after smoking marijuana.  Patient initially refused pelvic exam since she states that she has vaginal bleeding with going on.  Patient is not concerned for STDs.  Patient also has some back spasm but is neurovascularly intact.  Will get CBC and CMP and urinalysis and UDS. We will give pain medicine and muscle relaxants  and nausea medicine and reassess  10:09 PM Labs are unremarkable.  Patient went to the restroom but unfortunately was unable to catch the urine.  She lets me do her external pelvic exam and I do see a bladder prolapse and is difficult to reduce.  Since she is able to urinate with no difficulty patient can be discharged with follow-up with her GYN doctor.  We will give some muscle relaxant for her back.    Final Clinical Impression(s) / ED Diagnoses Final diagnoses:  None    Rx / DC Orders ED Discharge Orders    None       Drenda Freeze, MD 08/19/20 2216

## 2020-08-19 NOTE — ED Notes (Signed)
Pt requests medication for nausea

## 2020-08-19 NOTE — ED Notes (Signed)
Pt c/o HA.

## 2020-08-20 ENCOUNTER — Telehealth: Payer: Self-pay | Admitting: Obstetrics and Gynecology

## 2020-08-20 ENCOUNTER — Encounter: Payer: Self-pay | Admitting: Obstetrics and Gynecology

## 2020-08-20 ENCOUNTER — Ambulatory Visit (INDEPENDENT_AMBULATORY_CARE_PROVIDER_SITE_OTHER): Payer: 59 | Admitting: Obstetrics and Gynecology

## 2020-08-20 VITALS — BP 136/72 | HR 76 | Ht 64.25 in | Wt 295.0 lb

## 2020-08-20 DIAGNOSIS — R102 Pelvic and perineal pain: Secondary | ICD-10-CM

## 2020-08-20 DIAGNOSIS — R3915 Urgency of urination: Secondary | ICD-10-CM | POA: Diagnosis not present

## 2020-08-20 DIAGNOSIS — N8111 Cystocele, midline: Secondary | ICD-10-CM

## 2020-08-20 DIAGNOSIS — N816 Rectocele: Secondary | ICD-10-CM

## 2020-08-20 NOTE — Progress Notes (Signed)
GYNECOLOGY  VISIT   HPI: 39 y.o.   Married Other or two or more races Hispanic or Latino  female   [id number] with Patient's last menstrual period was 08/10/2020.   here for her bladder dropping.  She states that she has pain with sitting.  Friday she had a coughing fit and on Saturday her vagina was hanging out. It is very painful. She has had urinary urgency since Saturday, feels she is emptying her bladder. No frequency, voiding normal amounts. Feels like has a head is coming out.  She is bleeding still since 08/10/20 She is on Loestrin, no late or missed pills, her cycle started 8 days light.   GYNECOLOGIC HISTORY: Patient's last menstrual period was 08/10/2020. Contraception: OCP Menopausal hormone therapy: none        OB History    Gravida  5   Para  4   Term  4   Preterm  0   AB  1   Living  4     SAB  1   TAB  0   Ectopic  0   Multiple  0   Live Births  4              Patient Active Problem List   Diagnosis Date Noted  . IDA (iron deficiency anemia) 07/17/2020  . H/O tubal ligation 06/29/2018  . Anemia 06/29/2018  . Menometrorrhagia 06/29/2018    Past Medical History:  Diagnosis Date  . Abnormal Pap smear of cervix   . Abnormal uterine bleeding   . Anemia   . Anxiety   . Asthma    in the past  . Depression   . Medical history non-contributory   . Migraine without aura   . Ovarian cyst     Past Surgical History:  Procedure Laterality Date  . CESAREAN SECTION     times 2  . CESAREAN SECTION WITH BILATERAL TUBAL LIGATION    . COLPOSCOPY    . TUBAL LIGATION      Current Outpatient Medications  Medication Sig Dispense Refill  . diazepam (VALIUM) 5 MG tablet Take 1 tablet (5 mg total) by mouth every 8 (eight) hours as needed for anxiety (spasms). 8 tablet 0  . ibuprofen (ADVIL) 800 MG tablet Take 1 tablet (800 mg total) by mouth every 8 (eight) hours as needed. 30 tablet 1  . norethindrone-ethinyl estradiol (LOESTRIN FE) 1-20 MG-MCG tablet  Take 1 tablet by mouth daily. 84 tablet 0   No current facility-administered medications for this visit.     ALLERGIES: Bee venom and Codeine  Family History  Problem Relation Age of Onset  . Breast cancer Mother   . Hypertension Mother   . Diabetes Father   . Hypertension Father   . Stroke Father   . Heart attack Father   . Miscarriages / Stillbirths Neg Hx   . Obesity Neg Hx   . Vision loss Neg Hx   . Varicose Veins Neg Hx     Social History   Socioeconomic History  . Marital status: Married    Spouse name: Not on file  . Number of children: Not on file  . Years of education: Not on file  . Highest education level: Not on file  Occupational History  . Not on file  Tobacco Use  . Smoking status: Former Smoker    Packs/day: 0.50    Types: Cigarettes    Quit date: 07/17/2018    Years since quitting: 2.0  .  Smokeless tobacco: Never Used  Vaping Use  . Vaping Use: Never used  Substance and Sexual Activity  . Alcohol use: No  . Drug use: No  . Sexual activity: Yes    Partners: Male    Birth control/protection: Other-see comments    Comment: BTL  Other Topics Concern  . Not on file  Social History Narrative  . Not on file   Social Determinants of Health   Financial Resource Strain:   . Difficulty of Paying Living Expenses: Not on file  Food Insecurity:   . Worried About Charity fundraiser in the Last Year: Not on file  . Ran Out of Food in the Last Year: Not on file  Transportation Needs:   . Lack of Transportation (Medical): Not on file  . Lack of Transportation (Non-Medical): Not on file  Physical Activity:   . Days of Exercise per Week: Not on file  . Minutes of Exercise per Session: Not on file  Stress:   . Feeling of Stress : Not on file  Social Connections:   . Frequency of Communication with Friends and Family: Not on file  . Frequency of Social Gatherings with Friends and Family: Not on file  . Attends Religious Services: Not on file  . Active  Member of Clubs or Organizations: Not on file  . Attends Archivist Meetings: Not on file  . Marital Status: Not on file  Intimate Partner Violence:   . Fear of Current or Ex-Partner: Not on file  . Emotionally Abused: Not on file  . Physically Abused: Not on file  . Sexually Abused: Not on file    Review of Systems  All other systems reviewed and are negative.   PHYSICAL EXAMINATION:    BP 136/72   Pulse 76   Ht 5' 4.25" (1.632 m)   Wt 295 lb (133.8 kg)   LMP 08/10/2020   SpO2 98%   BMI 50.24 kg/m     General appearance: alert, cooperative and appears stated age   Pelvic: External genitalia:  no lesions              Urethra:  normal appearing urethra with no masses, tenderness or lesions              Bartholins and Skenes: normal                 Vagina: normal appearing vagina with normal color and discharge, no lesions. She has a grade 1 cystocele and grade one rectocele (examined supine and standing). She is tender with just touching the vagina with a swab. Small amount of blood in the vagina              Cervix: no lesions              Bimanual Exam:  Uterus:  normal size, contour, position, consistency, mobility, non-tender              Adnexa: no mass, fullness, tenderness              Chaperone was present for exam.  ASSESSMENT Mild vaginal prolapse on exam today Vaginal pain tenderness with gentle palpation with a swab, not c/w pain from prolapse Urinary urgency    PLAN Discussed prolapse, information given. Avoid heavy lifting and straining. Return if the prolapse worsens Send nuswab for vaginitis panel Urine for culture   ACOG handout on prolapse given

## 2020-08-20 NOTE — Telephone Encounter (Signed)
Patient asked for an appointment to see Dr.Jertsoin for a " prolapse bladder".

## 2020-08-20 NOTE — Telephone Encounter (Signed)
LMP 08/06/20 H/o iron deficiency anemia AEX 06/01/20  Spoke with pt. Pt states had coughing spell after smoking marijuana on Friday night and had some urinary leakage. Pt then noticed pain and something coming out of vagina. Pt states went to ER on Sunday and was told had bladder prolapse and needed possible surgery to correct. Pt states still having vaginal pain today that rates as 9-10 on pain scale. Pt states is taking Valium Rx for pain given by ER. Pt states not really helping.  Pt states cant sit, hold urine and difficult to put on clothes. Denies fever, chills, NVD. States still having some urinary incontinence .   Pt advised to have OV for follow up from ER and further evaluation. Pt agreeable. Pt scheduled today 08/20/20 at 1130 with Dr [name]. Pt verbalized understanding to date and time of appt.   Encounter closed.

## 2020-08-22 ENCOUNTER — Telehealth: Payer: Self-pay | Admitting: *Deleted

## 2020-08-22 LAB — NUSWAB VAGINITIS (VG)
Atopobium vaginae: HIGH Score — AB
BVAB 2: HIGH Score — AB
Candida albicans, NAA: NEGATIVE
Candida glabrata, NAA: NEGATIVE
Megasphaera 1: HIGH Score — AB
Trich vag by NAA: NEGATIVE

## 2020-08-22 LAB — URINE CULTURE

## 2020-08-22 NOTE — Telephone Encounter (Signed)
Patient is returning a call to Jill. °

## 2020-08-22 NOTE — Telephone Encounter (Signed)
Burnice Logan, RN  08/22/2020 1:53 PM EDT Back to Top    Left message to call Sharee Pimple, RN at [facility].

## 2020-08-22 NOTE — Telephone Encounter (Signed)
-----   Message from Salvadore Dom, MD sent at 08/22/2020  1:42 PM EDT ----- Please inform the patient that her vaginitis probe was + for BV and treat with flagyl (either oral or vaginal, her choice), no ETOH while on Flagyl.  Oral: Flagyl 500 mg BID x 7 days, or Vaginal: Metrogel, 1 applicator per vagina q day x 5 days. Please let her know that her urine grew out GBS. This can just be a vaginal contaminant, but if she is still having urinary urgency (or frequency or dysuria) then I would go ahead and treat her. Treat with Amoxicillin 500 mg q 8 hours x 5 days.

## 2020-08-23 MED ORDER — AMOXICILLIN 500 MG PO CAPS: 500.0000 mg | ORAL_CAPSULE | Freq: Three times a day (TID) | ORAL | 0 refills | Status: AC

## 2020-08-23 MED ORDER — METRONIDAZOLE 500 MG PO TABS: 500.0000 mg | ORAL_TABLET | Freq: Two times a day (BID) | ORAL | 0 refills | Status: DC

## 2020-08-23 NOTE — Telephone Encounter (Signed)
Spoke with pt. Pt given results and recommendations per Dr [name].  Pt agreeable to take Flagyl for +BV. Also states still having urinary pain when voiding. Denies urgency, frequency. Flagyl Rx and Amoxicllin Rx sent to pharmacy on file. Pt verbalized understanding.    Pt states still having heavy vaginal bleeding that she describes as soaking a pad every 1/2 hour x last 20 days. Pt was seen on 08/20/20 for mild vaginal prolapse. Had last iron infusion on 08/03/20 and started cycle on 08/06/20. Pt states taking Loestrin Rx as directed, denies skipped or missed pills.  Pt states feeling dizzy and lightheaded with bleeding. Advised will review with Dr [name] and return call with recommendations/advice. Pt agreeable.   Routing to Dr [name].

## 2020-08-23 NOTE — Telephone Encounter (Signed)
On recent blood work she does have mild anemia. Not as bad as it has been in the past.  She just started on OCP's a few months ago and irregular bleeding can persists for a few months. It shouldn't be as heavy as she is describing. If she is acutely feeling dizzy she should go to the ER. Ultrasound from July with 5.4 mm endometrial stripe, 3 small and stable intramural myomas.  I would make sure she is taking her pills at the same time daily. If she is stable set her back up for another appointment in the office, if she is acutely having a problem she needs to go to the ER.

## 2020-08-24 NOTE — Telephone Encounter (Signed)
Spoke with pt. Pt given recommendations per Dr [name]. Pt states still changing pad every 1/2 hour to 1 hour. Pt states still feeling dizzy, weak/tired. Denies cramps or abd pain. Pt states is working from home and laying down,  but will have husband take her to the ER today for evaluation. Pt states did not go to ER yet due to work.  States is taking OCP every day at the same and has not skipped or missed a pill. Pt also advised to have f/u appt with Dr [name] from ER. Pt agreeable. Pt scheduled with Dr [name] on 08/30/20 at 1145 am as work-in due to no available appts in schedule.   Routing to Dr [name] for update Encounter closed.

## 2020-08-27 ENCOUNTER — Telehealth: Payer: Self-pay

## 2020-08-27 NOTE — Telephone Encounter (Signed)
Patient is calling to follow up from the weekend. Patient stated that she was advised to go to the ED over the weekend and did not go.

## 2020-08-27 NOTE — Telephone Encounter (Signed)
Spoke with pt. Pt states calling to give update to Dr [name]. Pt states did not go to ER over weekend. States when woke up this morning, bleeding was better and not soaking a pad. Changed pad at 6 am from overnight, then changed at 8 am, pad not soaked and could have lasted longer. Denies any clots, heavy bleeding today.  Pt states still feeling dizzy, lightheaded and having headache. Advised to be seen as OV for follow up and further evaluation. Pt has OV with Dr [name] on 08/30/20 and pt states cannot come before then due to copay. Pt advised to be seen in ER earlier than appt if sx worsen. Pt agreeable and verbalized understanding. Pt advised might need blood/iron transfusion due to anemia history and long bleeding. Pt verbalized understanding.  Advised will give update to Dr [name]. Pt agreeable.   Routing to Dr [name].

## 2020-08-29 ENCOUNTER — Telehealth: Payer: Self-pay | Admitting: Obstetrics and Gynecology

## 2020-08-29 NOTE — Telephone Encounter (Signed)
Patient wants to give the nurse an update before her appointment on Thursday.

## 2020-08-29 NOTE — Telephone Encounter (Signed)
Spoke with pt. Pt states calling to give update to Dr [name]. Pt states vaginal bleeding has stopped as of 6 pm on 08/28/20. Pt states still having lightheadedness and slight headache, but feeling " stable" . Denies any clots, heavy bleeding, not wearing a pad or pantyliner at this time.  Pt asking if needs to keep follow up appt with Dr [name] on 9/23. Advised pt to keep appt due to still being symptomatic due to blood loss. Pt agreeable and verbalized understanding.   Encounter closed. Routing to Dr [name] for update.

## 2020-08-30 ENCOUNTER — Ambulatory Visit: Payer: Self-pay | Admitting: Obstetrics and Gynecology

## 2020-08-30 ENCOUNTER — Telehealth: Payer: Self-pay | Admitting: *Deleted

## 2020-08-30 NOTE — Telephone Encounter (Signed)
Call placed to patient to r/s OV scheduled for today at 11:45am w/ Dr. [name].  Offered patient OV for this afternoon or on 9/24, patient declined due to her work schedule.  OV scheduled for 09/05/20 at 4:15pm.  Patient is agreeable to date and time.  Encounter closed.

## 2020-09-05 ENCOUNTER — Other Ambulatory Visit: Payer: Self-pay

## 2020-09-05 ENCOUNTER — Encounter: Payer: Self-pay | Admitting: Obstetrics and Gynecology

## 2020-09-05 ENCOUNTER — Ambulatory Visit (INDEPENDENT_AMBULATORY_CARE_PROVIDER_SITE_OTHER): Payer: 59 | Admitting: Obstetrics and Gynecology

## 2020-09-05 ENCOUNTER — Telehealth: Payer: Self-pay | Admitting: *Deleted

## 2020-09-05 VITALS — BP 160/90 | HR 88 | Ht 64.0 in

## 2020-09-05 DIAGNOSIS — N946 Dysmenorrhea, unspecified: Secondary | ICD-10-CM | POA: Diagnosis not present

## 2020-09-05 DIAGNOSIS — N939 Abnormal uterine and vaginal bleeding, unspecified: Secondary | ICD-10-CM | POA: Diagnosis not present

## 2020-09-05 DIAGNOSIS — D508 Other iron deficiency anemias: Secondary | ICD-10-CM

## 2020-09-05 DIAGNOSIS — R030 Elevated blood-pressure reading, without diagnosis of hypertension: Secondary | ICD-10-CM | POA: Diagnosis not present

## 2020-09-05 LAB — POCT URINE PREGNANCY: Preg Test, Ur: NEGATIVE

## 2020-09-05 NOTE — Progress Notes (Signed)
GYNECOLOGY  VISIT   HPI: 39 y.o.   Married Other or two or more races Hispanic or Latino  female   [phone] with Patient's last menstrual period was 08/06/2020.   here for irregular bleeding. Patient has been bleeding since her last period. She states that she has had one day that she had not bled.   She was started on OCP's in mid July for menorrhagia leading to anemia and cramps. Her first cycle started on time, she bleed 3-4, she was changing her pad in 3-4 hours, cramps were a little better. Her cycle started on 08/06/20, 8 days late and she is still bleeding. Varies from normal to heavy. Going through in up to 2 hours, bad cramps. Mostly heavy.  She had some late pills recently only in the last week. No prior missed or late pills.    CBC on 08/19/20 with hgb of 10.9  GYNECOLOGIC HISTORY: Patient's last menstrual period was 08/06/2020. Contraception: OCP Menopausal hormone therapy: none         OB History    Gravida  5   Para  4   Term  4   Preterm  0   AB  1   Living  4     SAB  1   TAB  0   Ectopic  0   Multiple  0   Live Births  4              Patient Active Problem List   Diagnosis Date Noted  . IDA (iron deficiency anemia) 07/17/2020  . H/O tubal ligation 06/29/2018  . Anemia 06/29/2018  . Menometrorrhagia 06/29/2018    Past Medical History:  Diagnosis Date  . Abnormal Pap smear of cervix   . Abnormal uterine bleeding   . Anemia   . Anxiety   . Asthma    in the past  . Depression   . Medical history non-contributory   . Migraine without aura   . Ovarian cyst     Past Surgical History:  Procedure Laterality Date  . CESAREAN SECTION     times 2  . CESAREAN SECTION WITH BILATERAL TUBAL LIGATION    . COLPOSCOPY    . TUBAL LIGATION      Current Outpatient Medications  Medication Sig Dispense Refill  . diazepam (VALIUM) 5 MG tablet Take 1 tablet (5 mg total) by mouth every 8 (eight) hours as needed for anxiety (spasms). 8 tablet 0  .  ibuprofen (ADVIL) 800 MG tablet Take 1 tablet (800 mg total) by mouth every 8 (eight) hours as needed. 30 tablet 1  . norethindrone-ethinyl estradiol (LOESTRIN FE) 1-20 MG-MCG tablet Take 1 tablet by mouth daily. 84 tablet 0   No current facility-administered medications for this visit.     ALLERGIES: Bee venom and Codeine  Family History  Problem Relation Age of Onset  . Breast cancer Mother   . Hypertension Mother   . Diabetes Father   . Hypertension Father   . Stroke Father   . Heart attack Father   . Miscarriages / Stillbirths Neg Hx   . Obesity Neg Hx   . Vision loss Neg Hx   . Varicose Veins Neg Hx     Social History   Socioeconomic History  . Marital status: Married    Spouse name: Not on file  . Number of children: Not on file  . Years of education: Not on file  . Highest education level: Not on file  Occupational  History  . Not on file  Tobacco Use  . Smoking status: Former Smoker    Packs/day: 0.50    Types: Cigarettes    Quit date: 07/17/2018    Years since quitting: 2.1  . Smokeless tobacco: Never Used  Vaping Use  . Vaping Use: Never used  Substance and Sexual Activity  . Alcohol use: No  . Drug use: No  . Sexual activity: Yes    Partners: Male    Birth control/protection: Other-see comments    Comment: BTL  Other Topics Concern  . Not on file  Social History Narrative  . Not on file   Social Determinants of Health   Financial Resource Strain:   . Difficulty of Paying Living Expenses: Not on file  Food Insecurity:   . Worried About Charity fundraiser in the Last Year: Not on file  . Ran Out of Food in the Last Year: Not on file  Transportation Needs:   . Lack of Transportation (Medical): Not on file  . Lack of Transportation (Non-Medical): Not on file  Physical Activity:   . Days of Exercise per Week: Not on file  . Minutes of Exercise per Session: Not on file  Stress:   . Feeling of Stress : Not on file  Social Connections:   .  Frequency of Communication with Friends and Family: Not on file  . Frequency of Social Gatherings with Friends and Family: Not on file  . Attends Religious Services: Not on file  . Active Member of Clubs or Organizations: Not on file  . Attends Archivist Meetings: Not on file  . Marital Status: Not on file  Intimate Partner Violence:   . Fear of Current or Ex-Partner: Not on file  . Emotionally Abused: Not on file  . Physically Abused: Not on file  . Sexually Abused: Not on file    Review of Systems  Genitourinary:       Vaginal bleeding    PHYSICAL EXAMINATION:    BP (!) 150/90   Pulse 88   Ht 5\' 4"  (1.626 m)   LMP 08/06/2020   BMI 50.64 kg/m     General appearance: alert, cooperative and appears stated age  Pelvic: External genitalia:  no lesions              Urethra:  normal appearing urethra with no masses, tenderness or lesions              Bartholins and Skenes: normal                 Vagina: normal appearing vagina with normal color and discharge, no lesions              Cervix: no cervical motion tenderness and no lesions              Bimanual Exam:  Uterus:  anteverted, mobile, mildly tender, slightly enlarged              Adnexa: no mass, fullness, tenderness                Chaperone was present for exam.  ASSESSMENT Abnormal uterine bleeding leading to anemia. Having abnormal bleeding with OCP's Fibroids, not in her cavity Elevated BP without diagnosis of HTN    PLAN Stop OCP's Hgb 10.3 Send CBC, ferritin UPT negative Discussed the mirena IUD, would like to try it. Order placed She is not a great candidate for endometrial ablation We could try lysteda,  depoprovera (would worry about weight gain) if she can't get the mirena Recheck BP 160/90, f/u with primary

## 2020-09-05 NOTE — Telephone Encounter (Signed)
Return to work letter printed for patient while in office.   Encounter closed.

## 2020-09-06 ENCOUNTER — Encounter: Payer: Self-pay | Admitting: Obstetrics and Gynecology

## 2020-09-06 LAB — CBC
Hematocrit: 33.3 % — ABNORMAL LOW (ref 34.0–46.6)
Hemoglobin: 10.4 g/dL — ABNORMAL LOW (ref 11.1–15.9)
MCH: 24.2 pg — ABNORMAL LOW (ref 26.6–33.0)
MCHC: 31.2 g/dL — ABNORMAL LOW (ref 31.5–35.7)
MCV: 78 fL — ABNORMAL LOW (ref 79–97)
Platelets: 366 10*3/uL (ref 150–450)
RBC: 4.29 x10E6/uL (ref 3.77–5.28)
RDW: 22.8 % — ABNORMAL HIGH (ref 11.7–15.4)
WBC: 9.1 10*3/uL (ref 3.4–10.8)

## 2020-09-06 LAB — FERRITIN: Ferritin: 42 ng/mL (ref 15–150)

## 2020-09-07 ENCOUNTER — Telehealth: Payer: Self-pay

## 2020-09-07 ENCOUNTER — Other Ambulatory Visit: Payer: Self-pay

## 2020-09-07 DIAGNOSIS — Z30430 Encounter for insertion of intrauterine contraceptive device: Secondary | ICD-10-CM

## 2020-09-07 DIAGNOSIS — N939 Abnormal uterine and vaginal bleeding, unspecified: Secondary | ICD-10-CM

## 2020-09-07 MED ORDER — IBUPROFEN 800 MG PO TABS
800.0000 mg | ORAL_TABLET | Freq: Three times a day (TID) | ORAL | 1 refills | Status: DC | PRN
Start: 2020-09-07 — End: 2021-01-28

## 2020-09-07 NOTE — Telephone Encounter (Signed)
Call placed to convey benefits for Mirena insertion. Spoke with the patient and conveyed the benefits. Patient understands/agreeable with the benefits. Please call patient for scheduling.

## 2020-09-07 NOTE — Telephone Encounter (Signed)
Mirena IUD insertion scheduled for 09/10/20 at 8 am with Dr [name]. Pt agreeable and verbalized understanding.  Encounter closed. Orders placed

## 2020-09-07 NOTE — Telephone Encounter (Signed)
Patient is calling in regards to refill of Motrin.

## 2020-09-07 NOTE — Telephone Encounter (Signed)
Spoke with pt. Pt states needing new Rx of Ibuprofen for cycle pain due to fibroids.  Pt states had last Rx for Ibuprofen sent on 06/21/20, pharmacy states Rx expired. Pt requesting new Rx for pain.   Pt also made Mirena IUD insertion appt for 10/4 at 8 am with Dr [name]. Pt agreeable and verbalized understanding. Pt advised can take Motrin 800 mg with food and water one hour before procedure. Pt agreeable.   Routing to Dr [name] for Rx request. Rx pended # 30, 1 RF.

## 2020-09-10 ENCOUNTER — Other Ambulatory Visit: Payer: Self-pay

## 2020-09-10 ENCOUNTER — Ambulatory Visit (INDEPENDENT_AMBULATORY_CARE_PROVIDER_SITE_OTHER): Payer: 59 | Admitting: Obstetrics and Gynecology

## 2020-09-10 ENCOUNTER — Encounter: Payer: Self-pay | Admitting: Obstetrics and Gynecology

## 2020-09-10 VITALS — BP 130/78 | HR 109 | Ht 64.25 in | Wt 292.2 lb

## 2020-09-10 DIAGNOSIS — N939 Abnormal uterine and vaginal bleeding, unspecified: Secondary | ICD-10-CM | POA: Diagnosis not present

## 2020-09-10 DIAGNOSIS — D508 Other iron deficiency anemias: Secondary | ICD-10-CM

## 2020-09-10 DIAGNOSIS — Z30430 Encounter for insertion of intrauterine contraceptive device: Secondary | ICD-10-CM | POA: Diagnosis not present

## 2020-09-10 DIAGNOSIS — N946 Dysmenorrhea, unspecified: Secondary | ICD-10-CM | POA: Diagnosis not present

## 2020-09-10 NOTE — Patient Instructions (Signed)

## 2020-09-10 NOTE — Progress Notes (Signed)
GYNECOLOGY  VISIT   HPI: 39 y.o.   Married Other or two or more races Hispanic or Latino  female   [phone] with Patient's last menstrual period was 08/04/2020.   here for Mirena IUD insertion for AUB leading to anemia. Not helped with OCP's, also developed elevated BP on OCP's. She has a fibroid, not in the cavity. H/O tubal ligation.     GYNECOLOGIC HISTORY: Patient's last menstrual period was 08/04/2020. Contraception: Tubal ligation, OCP Menopausal hormone therapy: none         OB History    Gravida  5   Para  4   Term  4   Preterm  0   AB  1   Living  4     SAB  1   TAB  0   Ectopic  0   Multiple  0   Live Births  4              Patient Active Problem List   Diagnosis Date Noted  . IDA (iron deficiency anemia) 07/17/2020  . H/O tubal ligation 06/29/2018  . Anemia 06/29/2018  . Menometrorrhagia 06/29/2018    Past Medical History:  Diagnosis Date  . Abnormal Pap smear of cervix   . Abnormal uterine bleeding   . Anemia   . Anxiety   . Asthma    in the past  . Depression   . Medical history non-contributory   . Migraine without aura   . Ovarian cyst     Past Surgical History:  Procedure Laterality Date  . CESAREAN SECTION     times 2  . CESAREAN SECTION WITH BILATERAL TUBAL LIGATION    . COLPOSCOPY    . TUBAL LIGATION      Current Outpatient Medications  Medication Sig Dispense Refill  . diazepam (VALIUM) 5 MG tablet Take 1 tablet (5 mg total) by mouth every 8 (eight) hours as needed for anxiety (spasms). 8 tablet 0  . ibuprofen (ADVIL) 800 MG tablet Take 1 tablet (800 mg total) by mouth every 8 (eight) hours as needed. 30 tablet 1  . norethindrone-ethinyl estradiol (LOESTRIN FE) 1-20 MG-MCG tablet Take 1 tablet by mouth daily. 84 tablet 0   No current facility-administered medications for this visit.     ALLERGIES: Bee venom and Codeine  Family History  Problem Relation Age of Onset  . Breast cancer Mother   . Hypertension Mother    . Diabetes Father   . Hypertension Father   . Stroke Father   . Heart attack Father   . Miscarriages / Stillbirths Neg Hx   . Obesity Neg Hx   . Vision loss Neg Hx   . Varicose Veins Neg Hx     Social History   Socioeconomic History  . Marital status: Married    Spouse name: Not on file  . Number of children: Not on file  . Years of education: Not on file  . Highest education level: Not on file  Occupational History  . Not on file  Tobacco Use  . Smoking status: Former Smoker    Packs/day: 0.50    Types: Cigarettes    Quit date: 07/17/2018    Years since quitting: 2.1  . Smokeless tobacco: Never Used  Vaping Use  . Vaping Use: Never used  Substance and Sexual Activity  . Alcohol use: No  . Drug use: No  . Sexual activity: Yes    Partners: Male    Birth control/protection: Other-see comments  Comment: BTL  Other Topics Concern  . Not on file  Social History Narrative  . Not on file   Social Determinants of Health   Financial Resource Strain:   . Difficulty of Paying Living Expenses: Not on file  Food Insecurity:   . Worried About Charity fundraiser in the Last Year: Not on file  . Ran Out of Food in the Last Year: Not on file  Transportation Needs:   . Lack of Transportation (Medical): Not on file  . Lack of Transportation (Non-Medical): Not on file  Physical Activity:   . Days of Exercise per Week: Not on file  . Minutes of Exercise per Session: Not on file  Stress:   . Feeling of Stress : Not on file  Social Connections:   . Frequency of Communication with Friends and Family: Not on file  . Frequency of Social Gatherings with Friends and Family: Not on file  . Attends Religious Services: Not on file  . Active Member of Clubs or Organizations: Not on file  . Attends Archivist Meetings: Not on file  . Marital Status: Not on file  Intimate Partner Violence:   . Fear of Current or Ex-Partner: Not on file  . Emotionally Abused: Not on file   . Physically Abused: Not on file  . Sexually Abused: Not on file    Review of Systems  Genitourinary:       Vaginal bleeding  All other systems reviewed and are negative.   PHYSICAL EXAMINATION:    BP 130/78   Pulse (!) 109   Ht 5' 4.25" (1.632 m)   Wt 292 lb 3.2 oz (132.5 kg)   LMP 08/04/2020   SpO2 98%   BMI 49.77 kg/m     General appearance: alert, cooperative and appears stated age  Pelvic: External genitalia:  no lesions              Urethra:  normal appearing urethra with no masses, tenderness or lesions              Bartholins and Skenes: normal                 Vagina: normal appearing vagina with normal color and discharge, no lesions              Cervix: no lesions  The risks of the mirena IUD were reviewed with the patient, including infection, abnormal bleeding and uterine perfortion. Consent was signed.  A speculum was placed in the vagina, the cervix was cleansed with betadine. A tenaculum was placed on the cervix, the uterus sounded to 9-10 cm. The cervix was dilated to a #5 Hagar  The mirena IUD was inserted without difficulty. The string were cut to 3-4 cm. The tenaculum was removed. Slight oozing from the tenaculum site was stopped with pressure.   The patient tolerated the procedure well.    Chaperone was present for exam.  ASSESSMENT AUB    PLAN Mirena IUD inserted F/U in one month

## 2020-09-11 ENCOUNTER — Inpatient Hospital Stay: Payer: 59 | Attending: Family

## 2020-09-11 ENCOUNTER — Telehealth: Payer: Self-pay | Admitting: Family

## 2020-09-11 ENCOUNTER — Inpatient Hospital Stay (HOSPITAL_BASED_OUTPATIENT_CLINIC_OR_DEPARTMENT_OTHER): Payer: 59 | Admitting: Family

## 2020-09-11 ENCOUNTER — Encounter: Payer: Self-pay | Admitting: Family

## 2020-09-11 ENCOUNTER — Other Ambulatory Visit: Payer: Self-pay

## 2020-09-11 VITALS — BP 135/77 | HR 81 | Temp 98.7°F | Resp 18 | Ht 64.0 in | Wt 292.0 lb

## 2020-09-11 DIAGNOSIS — N921 Excessive and frequent menstruation with irregular cycle: Secondary | ICD-10-CM

## 2020-09-11 DIAGNOSIS — Z79899 Other long term (current) drug therapy: Secondary | ICD-10-CM | POA: Insufficient documentation

## 2020-09-11 DIAGNOSIS — N920 Excessive and frequent menstruation with regular cycle: Secondary | ICD-10-CM | POA: Insufficient documentation

## 2020-09-11 DIAGNOSIS — R5383 Other fatigue: Secondary | ICD-10-CM | POA: Diagnosis not present

## 2020-09-11 DIAGNOSIS — R519 Headache, unspecified: Secondary | ICD-10-CM | POA: Insufficient documentation

## 2020-09-11 DIAGNOSIS — R42 Dizziness and giddiness: Secondary | ICD-10-CM | POA: Diagnosis not present

## 2020-09-11 DIAGNOSIS — D500 Iron deficiency anemia secondary to blood loss (chronic): Secondary | ICD-10-CM | POA: Diagnosis present

## 2020-09-11 LAB — CBC WITH DIFFERENTIAL (CANCER CENTER ONLY)
Abs Immature Granulocytes: 0.02 10*3/uL (ref 0.00–0.07)
Basophils Absolute: 0 10*3/uL (ref 0.0–0.1)
Basophils Relative: 1 %
Eosinophils Absolute: 0.1 10*3/uL (ref 0.0–0.5)
Eosinophils Relative: 2 %
HCT: 23.9 % — ABNORMAL LOW (ref 36.0–46.0)
Hemoglobin: 7 g/dL — ABNORMAL LOW (ref 12.0–15.0)
Immature Granulocytes: 0 %
Lymphocytes Relative: 28 %
Lymphs Abs: 1.7 10*3/uL (ref 0.7–4.0)
MCH: 24.4 pg — ABNORMAL LOW (ref 26.0–34.0)
MCHC: 29.3 g/dL — ABNORMAL LOW (ref 30.0–36.0)
MCV: 83.3 fL (ref 80.0–100.0)
Monocytes Absolute: 0.3 10*3/uL (ref 0.1–1.0)
Monocytes Relative: 5 %
Neutro Abs: 3.9 10*3/uL (ref 1.7–7.7)
Neutrophils Relative %: 64 %
Platelet Count: 306 10*3/uL (ref 150–400)
RBC: 2.87 MIL/uL — ABNORMAL LOW (ref 3.87–5.11)
RDW: 23.2 % — ABNORMAL HIGH (ref 11.5–15.5)
WBC Count: 6 10*3/uL (ref 4.0–10.5)
nRBC: 0.3 % — ABNORMAL HIGH (ref 0.0–0.2)

## 2020-09-11 LAB — RETICULOCYTES
Immature Retic Fract: 31.5 % — ABNORMAL HIGH (ref 2.3–15.9)
RBC.: 2.83 MIL/uL — ABNORMAL LOW (ref 3.87–5.11)
Retic Count, Absolute: 142.3 10*3/uL (ref 19.0–186.0)
Retic Ct Pct: 5 % — ABNORMAL HIGH (ref 0.4–3.1)

## 2020-09-11 LAB — IRON AND TIBC
Iron: 12 ug/dL — ABNORMAL LOW (ref 41–142)
Saturation Ratios: 4 % — ABNORMAL LOW (ref 21–57)
TIBC: 329 ug/dL (ref 236–444)
UIBC: 317 ug/dL (ref 120–384)

## 2020-09-11 LAB — FERRITIN: Ferritin: 14 ng/mL (ref 11–307)

## 2020-09-11 NOTE — Telephone Encounter (Signed)
Appointments scheduled calendar printed & mailed per 10/5 los

## 2020-09-11 NOTE — Progress Notes (Signed)
Hematology and Oncology Follow Up Visit  Terri Walker [phone] [birth year] 39 y.o. 09/11/2020   Principle Diagnosis:  Iron deficiency secondary to heavy cycles   Current Therapy:   IV iron as indicated    Interim History:  Terri Walker is here today for follow-up. She is symptomatic with fatigue and no appetite.  She has heavy irregular cycles and had an IUD (Mirena) placed yesterday by Terri Walker. Hopefully this will successfully regulate/slow down her cycle.  Her vaginal bleeding stopped last night and she has not noted any other blood loss.  No abnormal bruising or petechiae.  Hgb is 7.0, MCV 83, platelets 306. She denies feeling distressed at this time.  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  No fever, chills, cough, rash, SOB, chest pain, palpitations, abdominal pain or changes in bowel or bladder habits.  No swelling, tenderness, numbness or tingling in her extremities.  No falls or syncopal episodes to report.  She does not have much of an appetite right now but is staying well hydrated. Her weight is stable at 292 lbs.   ECOG Performance Status: 1 - Symptomatic but completely ambulatory  Medications:  Allergies as of 09/11/2020      Reactions   Bee Venom Anaphylaxis   Codeine Hives, Shortness Of Breath      Medication List       Accurate as of September 11, 2020  8:56 AM. If you have any questions, ask your nurse or doctor.        diazepam 5 MG tablet Commonly known as: Valium Take 1 tablet (5 mg total) by mouth every 8 (eight) hours as needed for anxiety (spasms).   ibuprofen 800 MG tablet Commonly known as: ADVIL Take 1 tablet (800 mg total) by mouth every 8 (eight) hours as needed.   norethindrone-ethinyl estradiol 1-20 MG-MCG tablet Commonly known as: LOESTRIN FE Take 1 tablet by mouth daily.       Allergies:  Allergies  Allergen Reactions    Bee Venom Anaphylaxis   Codeine Hives and Shortness Of Breath    Past Medical History, Surgical history, Social history, and Family History were reviewed and updated.  Review of Systems: All other 10 point review of systems is negative.   Physical Exam:  vitals were not taken for this visit.   Wt Readings from Last 3 Encounters:  09/10/20 292 lb 3.2 oz (132.5 kg)  08/20/20 295 lb (133.8 kg)  08/19/20 290 lb (131.5 kg)    Ocular: Sclerae unicteric, pupils equal, round and reactive to light Ear-nose-throat: Oropharynx clear, dentition fair Lymphatic: No cervical or supraclavicular adenopathy Lungs no rales or rhonchi, good excursion bilaterally Heart regular rate and rhythm, no murmur appreciated Abd soft, nontender, positive bowel sounds MSK no focal spinal tenderness, no joint edema Neuro: non-focal, well-oriented, appropriate affect Breasts: Deferred   Lab Results  Component Value Date   WBC 9.1 09/05/2020   HGB 10.4 (L) 09/05/2020   HCT 33.3 (L) 09/05/2020   MCV 78 (L) 09/05/2020   PLT 366 09/05/2020   Lab Results  Component Value Date   FERRITIN 42 09/05/2020   IRON 20 (L) 07/17/2020   TIBC 438 07/17/2020   UIBC 418 (H) 07/17/2020   IRONPCTSAT 4 (L) 07/17/2020   Lab Results  Component Value Date   RETICCTPCT 1.6 07/17/2020   RBC 4.29 09/05/2020   No results found for: KPAFRELGTCHN, LAMBDASER, KAPLAMBRATIO No  results found for: IGGSERUM, IGA, IGMSERUM No results found for: Odetta Pink, SPEI   Chemistry      Component Value Date/Time   NA 137 08/19/2020 2038   NA 138 06/01/2020 0905   K 3.7 08/19/2020 2038   CL 104 08/19/2020 2038   CO2 24 08/19/2020 2038   BUN 15 08/19/2020 2038   BUN 14 06/01/2020 0905   CREATININE 0.85 08/19/2020 2038   CREATININE 0.82 07/17/2020 0822      Component Value Date/Time   CALCIUM 8.6 (L) 08/19/2020 2038   ALKPHOS 52 08/19/2020 2038   AST 13 (L) 08/19/2020  2038   AST 8 (L) 07/17/2020 0822   ALT 12 08/19/2020 2038   ALT 7 07/17/2020 0822   BILITOT <0.1 (L) 08/19/2020 2038   BILITOT 0.3 07/17/2020 0100       Impression and Plan: Terri Walker is a very pleasant 39 yo [place] female with history of iron deficiency anemia secondary to heavy cycles.  We will see what her iron studies look like and get her set up for IV replacement over the next 2 weeks.  Follow-up in 8 weeks.  She can contact our office with any questions or concerns and go to the ED in the event of an emergency.   [name], NP 10/5/20218:56 AM

## 2020-09-18 ENCOUNTER — Other Ambulatory Visit: Payer: Self-pay

## 2020-09-18 ENCOUNTER — Inpatient Hospital Stay: Payer: 59

## 2020-09-18 VITALS — BP 133/69 | HR 80 | Temp 98.7°F | Resp 18

## 2020-09-18 DIAGNOSIS — N921 Excessive and frequent menstruation with irregular cycle: Secondary | ICD-10-CM

## 2020-09-18 DIAGNOSIS — D500 Iron deficiency anemia secondary to blood loss (chronic): Secondary | ICD-10-CM

## 2020-09-18 MED ORDER — SODIUM CHLORIDE 0.9 % IV SOLN
200.0000 mg | Freq: Once | INTRAVENOUS | Status: AC
  Administered 2020-09-18: 200 mg via INTRAVENOUS
  Filled 2020-09-18: qty 200

## 2020-09-18 MED ORDER — SODIUM CHLORIDE 0.9 % IV SOLN
Freq: Once | INTRAVENOUS | Status: AC
  Filled 2020-09-18: qty 250

## 2020-09-18 NOTE — Progress Notes (Signed)
Patient declined to stay for the post infusion observation period. Patient denies any difficulty with this infusion in the past and is aware to call with any questions or concerns.   Pt verbalized understanding and had no further questions today.   

## 2020-09-18 NOTE — Patient Instructions (Signed)

## 2020-09-21 ENCOUNTER — Inpatient Hospital Stay: Payer: 59

## 2020-09-21 ENCOUNTER — Other Ambulatory Visit: Payer: Self-pay

## 2020-09-21 VITALS — BP 128/70 | HR 80 | Temp 98.2°F | Resp 18

## 2020-09-21 DIAGNOSIS — D500 Iron deficiency anemia secondary to blood loss (chronic): Secondary | ICD-10-CM | POA: Diagnosis not present

## 2020-09-21 DIAGNOSIS — N921 Excessive and frequent menstruation with irregular cycle: Secondary | ICD-10-CM

## 2020-09-21 MED ORDER — ACETAMINOPHEN 325 MG PO TABS
650.0000 mg | ORAL_TABLET | Freq: Once | ORAL | Status: AC
  Administered 2020-09-21: 650 mg via ORAL

## 2020-09-21 MED ORDER — ACETAMINOPHEN 325 MG PO TABS
ORAL_TABLET | ORAL | Status: AC
  Filled 2020-09-21: qty 2

## 2020-09-21 MED ORDER — SODIUM CHLORIDE 0.9 % IV SOLN
Freq: Once | INTRAVENOUS | Status: AC
  Filled 2020-09-21: qty 250

## 2020-09-21 MED ORDER — SODIUM CHLORIDE 0.9 % IV SOLN
200.0000 mg | Freq: Once | INTRAVENOUS | Status: AC
  Administered 2020-09-21: 200 mg via INTRAVENOUS
  Filled 2020-09-21: qty 200

## 2020-09-25 ENCOUNTER — Other Ambulatory Visit: Payer: Self-pay

## 2020-09-25 ENCOUNTER — Inpatient Hospital Stay: Payer: 59

## 2020-09-25 VITALS — BP 140/76 | HR 78 | Temp 99.5°F | Resp 18

## 2020-09-25 DIAGNOSIS — D500 Iron deficiency anemia secondary to blood loss (chronic): Secondary | ICD-10-CM

## 2020-09-25 DIAGNOSIS — N921 Excessive and frequent menstruation with irregular cycle: Secondary | ICD-10-CM

## 2020-09-25 MED ORDER — SODIUM CHLORIDE 0.9 % IV SOLN
200.0000 mg | Freq: Once | INTRAVENOUS | Status: AC
  Administered 2020-09-25: 200 mg via INTRAVENOUS
  Filled 2020-09-25: qty 200

## 2020-09-25 MED ORDER — SODIUM CHLORIDE 0.9 % IV SOLN
Freq: Once | INTRAVENOUS | Status: AC
  Filled 2020-09-25: qty 250

## 2020-09-25 NOTE — Progress Notes (Signed)
Pt. Refused to wait 30 minutes post infusion. Released stable and ASX. 

## 2020-09-26 ENCOUNTER — Encounter: Payer: Self-pay | Admitting: Obstetrics and Gynecology

## 2020-09-26 ENCOUNTER — Ambulatory Visit (INDEPENDENT_AMBULATORY_CARE_PROVIDER_SITE_OTHER): Payer: 59 | Admitting: Obstetrics and Gynecology

## 2020-09-26 VITALS — BP 122/64 | Ht 64.25 in | Wt 297.0 lb

## 2020-09-26 DIAGNOSIS — Z30431 Encounter for routine checking of intrauterine contraceptive device: Secondary | ICD-10-CM

## 2020-09-26 NOTE — Progress Notes (Signed)
GYNECOLOGY  VISIT   HPI: 39 y.o.   Married Other or two or more races Hispanic or Latino  female   [phone] with No LMP recorded.   here for follow up Mirena IUD insertion, the IUD was inserted on 09/10/20. She says that her bleeding stopped the same day she had her IUD put in.  Only spotting since then. She has had intermittent cramps.  She had the IUD placed secondary to AUB leading to anemia (not helped on OCP's). Her Hgb on 09/11/20 was 7, with a ferritin of 14. She is s/p iron transfusion x 2 since then. Starting to feel a little better.   GYNECOLOGIC HISTORY: No LMP recorded. Contraception:IUD  Menopausal hormone therapy: none         OB History    Gravida  5   Para  4   Term  4   Preterm  0   AB  1   Living  4     SAB  1   TAB  0   Ectopic  0   Multiple  0   Live Births  4              Patient Active Problem List   Diagnosis Date Noted  . IDA (iron deficiency anemia) 07/17/2020  . H/O tubal ligation 06/29/2018  . Anemia 06/29/2018  . Menometrorrhagia 06/29/2018    Past Medical History:  Diagnosis Date  . Abnormal Pap smear of cervix   . Abnormal uterine bleeding   . Anemia   . Anxiety   . Asthma    in the past  . Depression   . Medical history non-contributory   . Migraine without aura   . Ovarian cyst     Past Surgical History:  Procedure Laterality Date  . CESAREAN SECTION     times 2  . CESAREAN SECTION WITH BILATERAL TUBAL LIGATION    . COLPOSCOPY    . TUBAL LIGATION      Current Outpatient Medications  Medication Sig Dispense Refill  . diazepam (VALIUM) 5 MG tablet Take 1 tablet (5 mg total) by mouth every 8 (eight) hours as needed for anxiety (spasms). 8 tablet 0  . ibuprofen (ADVIL) 800 MG tablet Take 1 tablet (800 mg total) by mouth every 8 (eight) hours as needed. 30 tablet 1  . norethindrone-ethinyl estradiol (LOESTRIN FE) 1-20 MG-MCG tablet Take 1 tablet by mouth daily. 84 tablet 0   No current facility-administered  medications for this visit.     ALLERGIES: Bee venom and Codeine  Family History  Problem Relation Age of Onset  . Breast cancer Mother   . Hypertension Mother   . Diabetes Father   . Hypertension Father   . Stroke Father   . Heart attack Father   . Miscarriages / Stillbirths Neg Hx   . Obesity Neg Hx   . Vision loss Neg Hx   . Varicose Veins Neg Hx     Social History   Socioeconomic History  . Marital status: Married    Spouse name: Not on file  . Number of children: Not on file  . Years of education: Not on file  . Highest education level: Not on file  Occupational History  . Not on file  Tobacco Use  . Smoking status: Former Smoker    Packs/day: 0.50    Types: Cigarettes    Quit date: 07/17/2018    Years since quitting: 2.1  . Smokeless tobacco: Never Used  Vaping  Use  . Vaping Use: Never used  Substance and Sexual Activity  . Alcohol use: No  . Drug use: No  . Sexual activity: Yes    Partners: Male    Birth control/protection: Other-see comments    Comment: BTL  Other Topics Concern  . Not on file  Social History Narrative  . Not on file   Social Determinants of Health   Financial Resource Strain:   . Difficulty of Paying Living Expenses: Not on file  Food Insecurity:   . Worried About Charity fundraiser in the Last Year: Not on file  . Ran Out of Food in the Last Year: Not on file  Transportation Needs:   . Lack of Transportation (Medical): Not on file  . Lack of Transportation (Non-Medical): Not on file  Physical Activity:   . Days of Exercise per Week: Not on file  . Minutes of Exercise per Session: Not on file  Stress:   . Feeling of Stress : Not on file  Social Connections:   . Frequency of Communication with Friends and Family: Not on file  . Frequency of Social Gatherings with Friends and Family: Not on file  . Attends Religious Services: Not on file  . Active Member of Clubs or Organizations: Not on file  . Attends Archivist  Meetings: Not on file  . Marital Status: Not on file  Intimate Partner Violence:   . Fear of Current or Ex-Partner: Not on file  . Emotionally Abused: Not on file  . Physically Abused: Not on file  . Sexually Abused: Not on file    Review of Systems  All other systems reviewed and are negative.   PHYSICAL EXAMINATION:    BP 122/64   Ht 5' 4.25" (1.632 m)   Wt 297 lb (134.7 kg)   BMI 50.58 kg/m     General appearance: alert, cooperative and appears stated age   Pelvic: External genitalia:  no lesions              Urethra:  normal appearing urethra with no masses, tenderness or lesions              Bartholins and Skenes: normal                 Vagina: normal appearing vagina with normal color and discharge, no lesions              Cervix: no lesions and IUD string 3 cm              Bimanual Exam:  Uterus:  normal size, contour, position, consistency, mobility, non-tender and anteverted              Adnexa: no mass, fullness, tenderness                Chaperone was present for exam.  ASSESSMENT IUD check, so far doing well. Normal exam  Iron def anemia from AUB. S/P iron transfusion x 2 this month    PLAN Call with persistent heavy bleeding F/U with hematology as scheduled F/U for annual exam in 6/22

## 2020-09-28 ENCOUNTER — Other Ambulatory Visit: Payer: Self-pay

## 2020-09-28 ENCOUNTER — Inpatient Hospital Stay: Payer: 59

## 2020-09-28 VITALS — BP 140/87 | HR 75 | Resp 17

## 2020-09-28 DIAGNOSIS — D500 Iron deficiency anemia secondary to blood loss (chronic): Secondary | ICD-10-CM | POA: Diagnosis not present

## 2020-09-28 DIAGNOSIS — N921 Excessive and frequent menstruation with irregular cycle: Secondary | ICD-10-CM

## 2020-09-28 MED ORDER — SODIUM CHLORIDE 0.9 % IV SOLN
200.0000 mg | Freq: Once | INTRAVENOUS | Status: AC
  Administered 2020-09-28: 200 mg via INTRAVENOUS
  Filled 2020-09-28: qty 200

## 2020-09-28 MED ORDER — SODIUM CHLORIDE 0.9 % IV SOLN
Freq: Once | INTRAVENOUS | Status: AC
  Filled 2020-09-28: qty 250

## 2020-09-28 NOTE — Progress Notes (Signed)
Pt discharged in no apparent distress. Pt left ambulatory without assistance. Pt aware of discharge instructions and verbalized understanding and had no further questions.  

## 2020-09-28 NOTE — Patient Instructions (Signed)

## 2020-10-02 ENCOUNTER — Other Ambulatory Visit: Payer: Self-pay

## 2020-10-02 ENCOUNTER — Inpatient Hospital Stay: Payer: 59

## 2020-10-02 VITALS — BP 119/69 | HR 75 | Temp 98.5°F | Resp 17

## 2020-10-02 DIAGNOSIS — D500 Iron deficiency anemia secondary to blood loss (chronic): Secondary | ICD-10-CM | POA: Diagnosis not present

## 2020-10-02 DIAGNOSIS — N921 Excessive and frequent menstruation with irregular cycle: Secondary | ICD-10-CM

## 2020-10-02 MED ORDER — SODIUM CHLORIDE 0.9 % IV SOLN
Freq: Once | INTRAVENOUS | Status: AC
  Filled 2020-10-02: qty 250

## 2020-10-02 MED ORDER — SODIUM CHLORIDE 0.9 % IV SOLN
200.0000 mg | Freq: Once | INTRAVENOUS | Status: AC
  Administered 2020-10-02: 200 mg via INTRAVENOUS
  Filled 2020-10-02: qty 200

## 2020-10-02 NOTE — Patient Instructions (Signed)

## 2020-10-08 ENCOUNTER — Telehealth: Payer: Self-pay

## 2020-10-08 DIAGNOSIS — D508 Other iron deficiency anemias: Secondary | ICD-10-CM

## 2020-10-08 DIAGNOSIS — Z862 Personal history of diseases of the blood and blood-forming organs and certain disorders involving the immune mechanism: Secondary | ICD-10-CM

## 2020-10-08 NOTE — Telephone Encounter (Signed)
Left message for pt to return call to triage RN. 

## 2020-10-08 NOTE — Telephone Encounter (Signed)
Patient is calling in regards to questions about IUD.

## 2020-10-09 NOTE — Telephone Encounter (Signed)
Spoke back with pt. Pt states cramps are not worse than with regular cycle and is getting better. Pt advised to call back if cramping pain changes to have PUS. Pt agreeable.  Pt states has lab draw on 12/6 per Dr Antonieta Pert office since having last transfusion on 10/02/20.  Advised will give update to Dr [name] and return call if repeat labs needed now. Pt agreeable.   Routing to Dr [name].

## 2020-10-09 NOTE — Telephone Encounter (Signed)
Patient returned call

## 2020-10-09 NOTE — Telephone Encounter (Signed)
If her cramping is worse than with her typical cycles or doesn't resolve soon she should have an ultrasound.  She was also very anemic on 09/11/20, she has had several iron transfusions since then. She should come in for a CBC and ferritin level.

## 2020-10-09 NOTE — Telephone Encounter (Signed)
H/O iron def anemia  Last iron infusions 10/26,10/22,10/19,10/15 Mirena IUD placed-10/4 LMP 10/24  Spoke with pt. Pt states having concerns with bleeding since IUD placement. Pt had Mirena IUD placed on 10/04 and started next cycle on 10/24 and is still bleeding  for last 10 days. Pt states changing a pad every 2 hours to be clean, but not soaking and only 3/4 of pad full when changed at bathroom breaks.  Pt denies any heavy soaking pads, large clots or feeling dizzy, weak or lightheaded. Pt states bleeding has improved since having IUD placed. Pt states is cramping and taking two 800 mg Ibuprofen every 3 hours for cramps that she rates as 7 on pain scale. Pt states has stopped using Marijuana x 3 weeks ago. Pt advised to only take one 800 mg ibuprofen every 8 hours.  Pt advised that its normal to have irregular bleeding cycles after getting IUD insertion and it can take up to 3 months for body to adjust. Pt advised heavy bleeding protocol and precautions. Pt also advised will give update to Dr [name] and return call with any further recommendations. Pt agreeable and verbalized understanding.   Routing to Dr [name], please advise.

## 2020-10-10 NOTE — Telephone Encounter (Signed)
If her bleeding is heavy, I want to make sure she isn't getting dangerously anemic. If she is feeling fine and her bleeding is light then I think she can wait.

## 2020-10-10 NOTE — Telephone Encounter (Signed)
Spoke with pt. Pt states bleeding has not gotten any worse since last phone note. Pt only changing pad every 2-3 hours to be clean. Denies any heavy bleeding, clots or soaking pads. Pt states feeling good, but would feel better knowing blood and iron level before next check with Dr Marin Olp in Dec. Pt advised ok to have labs per Dr [name].  Orders placed. Pt scheduled for CBC and Ferritin level 11/5 at 325 pm per pts request due to work schedule.   Routing to Dr [name] for update  Encounter closed.

## 2020-10-12 ENCOUNTER — Other Ambulatory Visit: Payer: Self-pay

## 2020-10-12 ENCOUNTER — Other Ambulatory Visit (INDEPENDENT_AMBULATORY_CARE_PROVIDER_SITE_OTHER): Payer: 59

## 2020-10-12 DIAGNOSIS — D508 Other iron deficiency anemias: Secondary | ICD-10-CM

## 2020-10-13 LAB — CBC
Hematocrit: 34.2 % (ref 34.0–46.6)
Hemoglobin: 10.4 g/dL — ABNORMAL LOW (ref 11.1–15.9)
MCH: 24.8 pg — ABNORMAL LOW (ref 26.6–33.0)
MCHC: 30.4 g/dL — ABNORMAL LOW (ref 31.5–35.7)
MCV: 82 fL (ref 79–97)
Platelets: 354 10*3/uL (ref 150–450)
RBC: 4.19 x10E6/uL (ref 3.77–5.28)
RDW: 18 % — ABNORMAL HIGH (ref 11.7–15.4)
WBC: 8.5 10*3/uL (ref 3.4–10.8)

## 2020-10-13 LAB — FERRITIN: Ferritin: 78 ng/mL (ref 15–150)

## 2020-10-16 ENCOUNTER — Telehealth: Payer: Self-pay

## 2020-10-16 NOTE — Telephone Encounter (Signed)
Called [phone] and left a vm with r/s appt from 11/12/20 to 11/16/20.Marland KitchenMarland KitchenAOM

## 2020-11-12 ENCOUNTER — Ambulatory Visit: Payer: 59 | Admitting: Family

## 2020-11-12 ENCOUNTER — Telehealth: Payer: Self-pay

## 2020-11-12 ENCOUNTER — Inpatient Hospital Stay: Payer: 59

## 2020-11-12 NOTE — Telephone Encounter (Signed)
AEX 05/2020 IUD inserted 09/10/20 by Gillermina Hu with pt. Pt calling today to state she is still having heavy vaginal bleeding that started on 10/21. 2.5  weeks after IUD insertion. Pt states changing pad every 1-2 hours that is soaked and having clots size dime to nickel with abd cramps. Pt states was SA before bleeding started with no problems or pain. Pt states feeling lightheaded and dizzy at times, " just like before" but is working and sitting and is manageable. Had last iron infusion on 10/03/20. Has labs and OV with [name] on 11/15/20 for follow up.  Denies seeing IUD expel or having string out of vagina.   Pt advised to have OV to check IUD and be evaluated for heavy bleeding. Pt states cannot be seen this week due to work schedule. Pt scheduled with Dr [name] on 11/20/20. Pt agreeable to date and time of appt. Declines earlier offered appts. Pt advised ER precautions for heavy bleeding. Pt verbalized understanding.  Routing to Dr [name] for update Encounter closed

## 2020-11-12 NOTE — Telephone Encounter (Signed)
Patient has questions for nurse regarding last appointment.

## 2020-11-15 ENCOUNTER — Telehealth: Payer: Self-pay | Admitting: Family

## 2020-11-15 ENCOUNTER — Encounter: Payer: Self-pay | Admitting: Family

## 2020-11-15 ENCOUNTER — Inpatient Hospital Stay: Payer: 59 | Attending: Family

## 2020-11-15 ENCOUNTER — Inpatient Hospital Stay (HOSPITAL_BASED_OUTPATIENT_CLINIC_OR_DEPARTMENT_OTHER): Payer: 59 | Admitting: Family

## 2020-11-15 ENCOUNTER — Other Ambulatory Visit: Payer: Self-pay

## 2020-11-15 VITALS — BP 149/89 | HR 74 | Temp 97.9°F | Resp 18 | Ht 64.0 in | Wt 297.0 lb

## 2020-11-15 DIAGNOSIS — N921 Excessive and frequent menstruation with irregular cycle: Secondary | ICD-10-CM

## 2020-11-15 DIAGNOSIS — N920 Excessive and frequent menstruation with regular cycle: Secondary | ICD-10-CM | POA: Diagnosis present

## 2020-11-15 DIAGNOSIS — D500 Iron deficiency anemia secondary to blood loss (chronic): Secondary | ICD-10-CM

## 2020-11-15 LAB — CBC WITH DIFFERENTIAL (CANCER CENTER ONLY)
Abs Immature Granulocytes: 0.11 10*3/uL — ABNORMAL HIGH (ref 0.00–0.07)
Basophils Absolute: 0 10*3/uL (ref 0.0–0.1)
Basophils Relative: 0 %
Eosinophils Absolute: 0.1 10*3/uL (ref 0.0–0.5)
Eosinophils Relative: 2 %
HCT: 40 % (ref 36.0–46.0)
Hemoglobin: 12 g/dL (ref 12.0–15.0)
Immature Granulocytes: 1 %
Lymphocytes Relative: 21 %
Lymphs Abs: 1.8 10*3/uL (ref 0.7–4.0)
MCH: 24.5 pg — ABNORMAL LOW (ref 26.0–34.0)
MCHC: 30 g/dL (ref 30.0–36.0)
MCV: 81.8 fL (ref 80.0–100.0)
Monocytes Absolute: 0.4 10*3/uL (ref 0.1–1.0)
Monocytes Relative: 4 %
Neutro Abs: 6.2 10*3/uL (ref 1.7–7.7)
Neutrophils Relative %: 72 %
Platelet Count: 292 10*3/uL (ref 150–400)
RBC: 4.89 MIL/uL (ref 3.87–5.11)
RDW: 16.7 % — ABNORMAL HIGH (ref 11.5–15.5)
WBC Count: 8.6 10*3/uL (ref 4.0–10.5)
nRBC: 0 % (ref 0.0–0.2)

## 2020-11-15 LAB — RETICULOCYTES
Immature Retic Fract: 11.7 % (ref 2.3–15.9)
RBC.: 4.87 MIL/uL (ref 3.87–5.11)
Retic Count, Absolute: 51.1 10*3/uL (ref 19.0–186.0)
Retic Ct Pct: 1.1 % (ref 0.4–3.1)

## 2020-11-15 LAB — IRON AND TIBC
Iron: 44 ug/dL (ref 41–142)
Saturation Ratios: 12 % — ABNORMAL LOW (ref 21–57)
TIBC: 371 ug/dL (ref 236–444)
UIBC: 327 ug/dL (ref 120–384)

## 2020-11-15 LAB — FERRITIN: Ferritin: 29 ng/mL (ref 11–307)

## 2020-11-15 NOTE — Telephone Encounter (Signed)
Appointments scheudled calendar printed per 12/9 los/ Calendar mailed

## 2020-11-15 NOTE — Progress Notes (Signed)
Hematology and Oncology Follow Up Visit  Terri Walker [id number] 1981/10/28 39 y.o. 11/15/2020   Principle Diagnosis:  Iron deficiency secondary to heavy cycles  Current Therapy:        IV iron as indicated    Interim History:  Terri Walker is here today with her husband for follow-up. She is doing fairly well but states that she is still having her cycle despite having the IUD. It remains heavy and she has noted clots.   She has an appointment with her gynecologist next week on Tuesday but plans to call and see if she can go in this week.  She has not noted any other blood loss. No abnormal bruising, no petechiae.  She is symptomatic with fatigue, loss of appetite, SOB with over exertion, occasional dizziness, headaches and trouble sleeping.  No fever, chills, n/v, cough, rash, chest pain, palpitations, abdominal pain or changes in bowel or bladder habits.  No swelling, tenderness, numbness or tingling in her extremities.  No falls or syncope.  She is staying well hydrated. Her weight is stable at 297 lbs.   ECOG Performance Status: 1 - Symptomatic but completely ambulatory  Medications:  Allergies as of 11/15/2020      Reactions   Bee Venom Anaphylaxis   Codeine Hives, Shortness Of Breath      Medication List       Accurate as of November 15, 2020  9:02 AM. If you have any questions, ask your nurse or doctor.        ibuprofen 800 MG tablet Commonly known as: ADVIL Take 1 tablet (800 mg total) by mouth every 8 (eight) hours as needed.       Allergies:  Allergies  Allergen Reactions  . Bee Venom Anaphylaxis  . Codeine Hives and Shortness Of Breath    Past Medical History, Surgical history, Social history, and Family History were reviewed and updated.  Review of Systems: All other 10 point review of systems is negative.   Physical Exam:  vitals were not taken for this visit.   Wt Readings from Last 3 Encounters:  09/26/20 297 lb (134.7 kg)   09/11/20 292 lb (132.5 kg)  09/10/20 292 lb 3.2 oz (132.5 kg)    Ocular: Sclerae unicteric, pupils equal, round and reactive to light Ear-nose-throat: Oropharynx clear, dentition fair Lymphatic: No cervical or supraclavicular adenopathy Lungs no rales or rhonchi, good excursion bilaterally Heart regular rate and rhythm, no murmur appreciated Abd soft, nontender, positive bowel sounds MSK no focal spinal tenderness, no joint edema Neuro: non-focal, well-oriented, appropriate affect Breasts: Deferred   Lab Results  Component Value Date   WBC 8.6 11/15/2020   HGB 12.0 11/15/2020   HCT 40.0 11/15/2020   MCV 81.8 11/15/2020   PLT 292 11/15/2020   Lab Results  Component Value Date   FERRITIN 78 10/12/2020   IRON 12 (L) 09/11/2020   TIBC 329 09/11/2020   UIBC 317 09/11/2020   IRONPCTSAT 4 (L) 09/11/2020   Lab Results  Component Value Date   RETICCTPCT 1.1 11/15/2020   RBC 4.87 11/15/2020   No results found for: KPAFRELGTCHN, LAMBDASER, KAPLAMBRATIO No results found for: IGGSERUM, IGA, IGMSERUM No results found for: Ronnald Ramp, A1GS, A2GS, Violet Baldy, MSPIKE, SPEI   Chemistry      Component Value Date/Time   NA 137 08/19/2020 2038   NA 138 06/01/2020 0905   K 3.7 08/19/2020 2038   CL 104 08/19/2020 2038   CO2 24 08/19/2020 2038   BUN  15 08/19/2020 2038   BUN 14 06/01/2020 0905   CREATININE 0.85 08/19/2020 2038   CREATININE 0.82 07/17/2020 0822      Component Value Date/Time   CALCIUM 8.6 (L) 08/19/2020 2038   ALKPHOS 52 08/19/2020 2038   AST 13 (L) 08/19/2020 2038   AST 8 (L) 07/17/2020 0822   ALT 12 08/19/2020 2038   ALT 7 07/17/2020 0822   BILITOT <0.1 (L) 08/19/2020 2038   BILITOT 0.3 07/17/2020 4709       Impression and Plan: Terri Walker is a very pleasant 39 yo [place] female with history of iron deficiency anemia secondary to heavy cycles. She follows up with her gynecologist next week to discuss further options for  her heavy cycle.  Iron studies are pending. We will replace if needed.  Follow-up in 3 months.  She was encouraged to contact our office with any questions or concerns. We can certainly see her sooner if needed.   Terri Peace, NP 12/9/20219:02 AM\

## 2020-11-16 ENCOUNTER — Encounter: Payer: Self-pay | Admitting: Obstetrics and Gynecology

## 2020-11-16 ENCOUNTER — Ambulatory Visit (INDEPENDENT_AMBULATORY_CARE_PROVIDER_SITE_OTHER): Payer: 59 | Admitting: Obstetrics and Gynecology

## 2020-11-16 ENCOUNTER — Telehealth: Payer: Self-pay | Admitting: Family

## 2020-11-16 VITALS — BP 138/86 | HR 86 | Resp 16 | Ht 64.0 in

## 2020-11-16 DIAGNOSIS — R102 Pelvic and perineal pain: Secondary | ICD-10-CM | POA: Diagnosis not present

## 2020-11-16 DIAGNOSIS — N921 Excessive and frequent menstruation with irregular cycle: Secondary | ICD-10-CM

## 2020-11-16 DIAGNOSIS — Z30431 Encounter for routine checking of intrauterine contraceptive device: Secondary | ICD-10-CM | POA: Diagnosis not present

## 2020-11-16 MED ORDER — OXYCODONE HCL 5 MG PO TABS
5.0000 mg | ORAL_TABLET | Freq: Four times a day (QID) | ORAL | 0 refills | Status: DC | PRN
Start: 2020-11-16 — End: 2021-01-28

## 2020-11-16 NOTE — Progress Notes (Signed)
GYNECOLOGY  VISIT   HPI: 39 y.o.   Married Other or two or more races Hispanic or Latino  female   [phone] with Patient's last menstrual period was 10/03/2020.   here for   IUD check for heavy vaginal bleeding that has been on-going since 10-03-20. Has headache today and intermittent dizziness. The patient had a mirena IUD placed on 09/10/20 secondary to AUB leading to anemia (not helped on OCP's).  Initially after insertion her bleeding ws light. Then on 10/27/21she started bleeding and has been , bleed is light is the am, gets heavier as the day goes on. By late morning she is bleeding heavily, saturating a pad in 1.5 hours.  She reports severe cramps in the last few months, Ibuprofen isn't helping, has tried tylenol as well.   She has a fibroid, not in the cavity. H/O tubal ligation.     Hgb 12 gm/dl, ferritin yesterday was 29. She is being set up for another iron transfusion.   GYNECOLOGIC HISTORY: Patient's last menstrual period was 10/03/2020. Contraception: BTL/ mirena IUD. Menopausal hormone therapy: none         OB History    Gravida  5   Para  4   Term  4   Preterm  0   AB  1   Living  4     SAB  1   IAB  0   Ectopic  0   Multiple  0   Live Births  4              Patient Active Problem List   Diagnosis Date Noted  . IDA (iron deficiency anemia) 07/17/2020  . H/O tubal ligation 06/29/2018  . Anemia 06/29/2018  . Menometrorrhagia 06/29/2018    Past Medical History:  Diagnosis Date  . Abnormal Pap smear of cervix   . Abnormal uterine bleeding   . Anemia   . Anxiety   . Asthma    in the past  . Depression   . Medical history non-contributory   . Migraine without aura   . Ovarian cyst     Past Surgical History:  Procedure Laterality Date  . CESAREAN SECTION     times 2  . CESAREAN SECTION WITH BILATERAL TUBAL LIGATION    . COLPOSCOPY    . TUBAL LIGATION      Current Outpatient Medications  Medication Sig Dispense Refill  . ibuprofen  (ADVIL) 800 MG tablet Take 1 tablet (800 mg total) by mouth every 8 (eight) hours as needed. 30 tablet 1  . levonorgestrel (MIRENA) 20 MCG/24HR IUD 1 each by Intrauterine route once.     No current facility-administered medications for this visit.     ALLERGIES: Bee venom and Codeine  Family History  Problem Relation Age of Onset  . Breast cancer Mother   . Hypertension Mother   . Diabetes Father   . Hypertension Father   . Stroke Father   . Heart attack Father   . Miscarriages / Stillbirths Neg Hx   . Obesity Neg Hx   . Vision loss Neg Hx   . Varicose Veins Neg Hx     Social History   Socioeconomic History  . Marital status: Married    Spouse name: Not on file  . Number of children: Not on file  . Years of education: Not on file  . Highest education level: Not on file  Occupational History  . Not on file  Tobacco Use  . Smoking status:  Former Smoker    Packs/day: 0.50    Types: Cigarettes    Quit date: 07/17/2018    Years since quitting: 2.3  . Smokeless tobacco: Never Used  Vaping Use  . Vaping Use: Never used  Substance and Sexual Activity  . Alcohol use: No  . Drug use: No  . Sexual activity: Yes    Partners: Male    Birth control/protection: Other-see comments    Comment: BTL  Other Topics Concern  . Not on file  Social History Narrative  . Not on file   Social Determinants of Health   Financial Resource Strain: Not on file  Food Insecurity: Not on file  Transportation Needs: Not on file  Physical Activity: Not on file  Stress: Not on file  Social Connections: Not on file  Intimate Partner Violence: Not on file    Review of Systems  Genitourinary:       Heavy bleeding with Mirena  dysmenorrhea  Neurological: Positive for dizziness and headaches.       Dizziness that comes and goes  All other systems reviewed and are negative.   PHYSICAL EXAMINATION:    BP 138/86 (BP Location: Left Arm, Patient Position: Sitting, Cuff Size: Large)   Pulse  86   Resp 16   Ht 5\' 4"  (1.626 m)   LMP 10/03/2020   BMI 50.98 kg/m     General appearance: alert, cooperative and appears stated age  Pelvic: External genitalia:  no lesions              Urethra:  normal appearing urethra with no masses, tenderness or lesions              Bartholins and Skenes: normal                 Vagina: normal appearing vagina with normal color and discharge, no lesions              Cervix: no cervical motion tenderness, no lesions and IUD string 4 cm              Bimanual Exam:  Uterus:  anteverted, top normal sized, tender.              Adnexa: no mass, fullness, tenderness               Chaperone was present for exam.  ASSESSMENT Menorrhagia, persisting with mirena IUD (placed 2 months ago) Pelvic pain, occurs with the heavy bleeding. Ibuprofen doesn't help. Requesting narcotics.     PLAN Return for an ultrasound Take ibuprofen and extra strength tylenol Discussed the possibility of endometrial ablation or hysterectomy as treatment options. Lysteda can only be taken 5 days a month I reviewed her last ultrasound after she left the office and have concerns that her cavity may be too big for endometrial ablation.  Will make further plans after her ultrasound I gave her a limited amount of oxycodone, I told her this is not a long term option and she agrees.   Over 20 minutes in total patient care.

## 2020-11-16 NOTE — Telephone Encounter (Signed)
Called and spoke with patient regarding appointments added she requested to get updated copy at 12/17 appt/ per 12/9 sch msg

## 2020-11-20 ENCOUNTER — Ambulatory Visit: Payer: Self-pay | Admitting: Obstetrics and Gynecology

## 2020-11-22 ENCOUNTER — Other Ambulatory Visit: Payer: Self-pay

## 2020-11-22 ENCOUNTER — Telehealth: Payer: Self-pay

## 2020-11-22 DIAGNOSIS — N921 Excessive and frequent menstruation with irregular cycle: Secondary | ICD-10-CM

## 2020-11-22 DIAGNOSIS — R102 Pelvic and perineal pain: Secondary | ICD-10-CM

## 2020-11-22 NOTE — Telephone Encounter (Signed)
Spoke with patient regarding benefits for recommended ultrasound. Patient is aware that ultrasound is transvaginal. Patient acknowledges understanding of information presented. Patient is aware of cancellation policy. Patient scheduled appointment for 12/13/2020 at 0800AM with Sumner Boast, MD.   Patient requesting to combine IUD recheck appointment that was scheduled for 12/12/2020. Cancelled appointment for 12/12/2020 and changed appointment notes to include IUD recheck on 12/13/2020.  Encounter closed.

## 2020-11-22 NOTE — Progress Notes (Signed)
PUS orders placed  Encounter closed

## 2020-11-23 ENCOUNTER — Ambulatory Visit: Payer: 59

## 2020-11-23 ENCOUNTER — Inpatient Hospital Stay: Payer: 59

## 2020-11-23 ENCOUNTER — Other Ambulatory Visit: Payer: Self-pay

## 2020-11-23 VITALS — BP 148/90 | HR 81 | Temp 98.3°F

## 2020-11-23 DIAGNOSIS — D500 Iron deficiency anemia secondary to blood loss (chronic): Secondary | ICD-10-CM

## 2020-11-23 DIAGNOSIS — N921 Excessive and frequent menstruation with irregular cycle: Secondary | ICD-10-CM

## 2020-11-23 MED ORDER — SODIUM CHLORIDE 0.9 % IV SOLN
200.0000 mg | Freq: Once | INTRAVENOUS | Status: AC
  Administered 2020-11-23: 14:00:00 200 mg via INTRAVENOUS
  Filled 2020-11-23: qty 200

## 2020-11-23 MED ORDER — SODIUM CHLORIDE 0.9 % IV SOLN
Freq: Once | INTRAVENOUS | Status: AC
  Filled 2020-11-23: qty 250

## 2020-11-23 NOTE — Progress Notes (Signed)
Pt discharged in no apparent distress. Pt left ambulatory without assistance. Pt aware of discharge instructions and verbalized understanding and had no further questions.  

## 2020-11-27 ENCOUNTER — Other Ambulatory Visit: Payer: Self-pay

## 2020-11-27 ENCOUNTER — Inpatient Hospital Stay: Payer: 59

## 2020-11-27 VITALS — BP 151/88 | HR 72 | Temp 98.1°F | Resp 16

## 2020-11-27 DIAGNOSIS — D500 Iron deficiency anemia secondary to blood loss (chronic): Secondary | ICD-10-CM | POA: Diagnosis not present

## 2020-11-27 DIAGNOSIS — N921 Excessive and frequent menstruation with irregular cycle: Secondary | ICD-10-CM

## 2020-11-27 MED ORDER — SODIUM CHLORIDE 0.9 % IV SOLN
200.0000 mg | Freq: Once | INTRAVENOUS | Status: AC
  Administered 2020-11-27: 13:00:00 200 mg via INTRAVENOUS
  Filled 2020-11-27: qty 200

## 2020-11-27 MED ORDER — SODIUM CHLORIDE 0.9 % IV SOLN
Freq: Once | INTRAVENOUS | Status: AC
  Filled 2020-11-27: qty 250

## 2020-11-27 NOTE — Patient Instructions (Signed)

## 2020-11-29 ENCOUNTER — Inpatient Hospital Stay: Payer: 59

## 2020-11-29 ENCOUNTER — Other Ambulatory Visit: Payer: Self-pay

## 2020-11-29 VITALS — BP 170/87 | HR 87 | Resp 17

## 2020-11-29 DIAGNOSIS — N921 Excessive and frequent menstruation with irregular cycle: Secondary | ICD-10-CM

## 2020-11-29 DIAGNOSIS — D500 Iron deficiency anemia secondary to blood loss (chronic): Secondary | ICD-10-CM | POA: Diagnosis not present

## 2020-11-29 MED ORDER — SODIUM CHLORIDE 0.9 % IV SOLN
Freq: Once | INTRAVENOUS | Status: AC
  Filled 2020-11-29: qty 250

## 2020-11-29 MED ORDER — SODIUM CHLORIDE 0.9 % IV SOLN
200.0000 mg | Freq: Once | INTRAVENOUS | Status: AC
  Administered 2020-11-29: 13:00:00 200 mg via INTRAVENOUS
  Filled 2020-11-29: qty 200

## 2020-11-29 NOTE — Patient Instructions (Signed)
Pt discharged in no apparent distress. Pt left ambulatory without assistance. Pt aware of discharge instructions and verbalized understanding and had no further questions.Iron Sucrose injection What is this medicine? IRON SUCROSE (AHY ern SOO krohs) is an iron complex. Iron is used to make healthy red blood cells, which carry oxygen and nutrients throughout the body. This medicine is used to treat iron deficiency anemia in people with chronic kidney disease. This medicine may be used for other purposes; ask your health care provider or pharmacist if you have questions. COMMON BRAND NAME(S): Venofer What should I tell my health care provider before I take this medicine? They need to know if you have any of these conditions:  anemia not caused by low iron levels  heart disease  high levels of iron in the blood  kidney disease  liver disease  an unusual or allergic reaction to iron, other medicines, foods, dyes, or preservatives  pregnant or trying to get pregnant  breast-feeding How should I use this medicine? This medicine is for infusion into a vein. It is given by a health care professional in a hospital or clinic setting. Talk to your pediatrician regarding the use of this medicine in children. While this drug may be prescribed for children as young as 2 years for selected conditions, precautions do apply. Overdosage: If you think you have taken too much of this medicine contact a poison control center or emergency room at once. NOTE: This medicine is only for you. Do not share this medicine with others. What if I miss a dose? It is important not to miss your dose. Call your doctor or health care professional if you are unable to keep an appointment. What may interact with this medicine? Do not take this medicine with any of the following medications:  deferoxamine  dimercaprol  other iron products This medicine may also interact with the following  medications:  chloramphenicol  deferasirox This list may not describe all possible interactions. Give your health care provider a list of all the medicines, herbs, non-prescription drugs, or dietary supplements you use. Also tell them if you smoke, drink alcohol, or use illegal drugs. Some items may interact with your medicine. What should I watch for while using this medicine? Visit your doctor or healthcare professional regularly. Tell your doctor or healthcare professional if your symptoms do not start to get better or if they get worse. You may need blood work done while you are taking this medicine. You may need to follow a special diet. Talk to your doctor. Foods that contain iron include: whole grains/cereals, dried fruits, beans, or peas, leafy green vegetables, and organ meats (liver, kidney). What side effects may I notice from receiving this medicine? Side effects that you should report to your doctor or health care professional as soon as possible:  allergic reactions like skin rash, itching or hives, swelling of the face, lips, or tongue  breathing problems  changes in blood pressure  cough  fast, irregular heartbeat  feeling faint or lightheaded, falls  fever or chills  flushing, sweating, or hot feelings  joint or muscle aches/pains  seizures  swelling of the ankles or feet  unusually weak or tired Side effects that usually do not require medical attention (report to your doctor or health care professional if they continue or are bothersome):  diarrhea  feeling achy  headache  irritation at site where injected  nausea, vomiting  stomach upset  tiredness This list may not describe all possible side   effects. Call your doctor for medical advice about side effects. You may report side effects to FDA at 1-800-FDA-1088. Where should I keep my medicine? This drug is given in a hospital or clinic and will not be stored at home. NOTE: This sheet is a  summary. It may not cover all possible information. If you have questions about this medicine, talk to your doctor, pharmacist, or health care provider.  2020 Elsevier/Gold Standard (2011-09-04 17:14:35)  

## 2020-12-03 ENCOUNTER — Inpatient Hospital Stay: Payer: 59

## 2020-12-03 ENCOUNTER — Other Ambulatory Visit: Payer: Self-pay

## 2020-12-03 VITALS — BP 150/82 | HR 74 | Temp 98.3°F | Resp 17

## 2020-12-03 DIAGNOSIS — D500 Iron deficiency anemia secondary to blood loss (chronic): Secondary | ICD-10-CM | POA: Diagnosis not present

## 2020-12-03 DIAGNOSIS — N921 Excessive and frequent menstruation with irregular cycle: Secondary | ICD-10-CM

## 2020-12-03 MED ORDER — SODIUM CHLORIDE 0.9 % IV SOLN
200.0000 mg | Freq: Once | INTRAVENOUS | Status: AC
  Administered 2020-12-03: 13:00:00 200 mg via INTRAVENOUS
  Filled 2020-12-03: qty 200

## 2020-12-03 MED ORDER — SODIUM CHLORIDE 0.9 % IV SOLN
Freq: Once | INTRAVENOUS | Status: AC
  Filled 2020-12-03: qty 250

## 2020-12-03 NOTE — Patient Instructions (Addendum)

## 2020-12-05 ENCOUNTER — Inpatient Hospital Stay: Payer: 59

## 2020-12-05 ENCOUNTER — Other Ambulatory Visit: Payer: Self-pay

## 2020-12-05 VITALS — BP 152/92 | HR 75 | Temp 97.8°F | Resp 17

## 2020-12-05 DIAGNOSIS — N921 Excessive and frequent menstruation with irregular cycle: Secondary | ICD-10-CM

## 2020-12-05 DIAGNOSIS — D500 Iron deficiency anemia secondary to blood loss (chronic): Secondary | ICD-10-CM | POA: Diagnosis not present

## 2020-12-05 MED ORDER — SODIUM CHLORIDE 0.9 % IV SOLN
200.0000 mg | Freq: Once | INTRAVENOUS | Status: AC
  Administered 2020-12-05: 14:00:00 200 mg via INTRAVENOUS
  Filled 2020-12-05: qty 200

## 2020-12-05 MED ORDER — SODIUM CHLORIDE 0.9 % IV SOLN
Freq: Once | INTRAVENOUS | Status: AC
  Filled 2020-12-05: qty 250

## 2020-12-05 NOTE — Patient Instructions (Signed)

## 2020-12-12 ENCOUNTER — Ambulatory Visit: Payer: 59 | Admitting: Obstetrics and Gynecology

## 2020-12-13 ENCOUNTER — Encounter: Payer: Self-pay | Admitting: Obstetrics and Gynecology

## 2020-12-13 ENCOUNTER — Ambulatory Visit (INDEPENDENT_AMBULATORY_CARE_PROVIDER_SITE_OTHER): Payer: 59 | Admitting: Obstetrics and Gynecology

## 2020-12-13 ENCOUNTER — Other Ambulatory Visit: Payer: Self-pay

## 2020-12-13 ENCOUNTER — Ambulatory Visit (INDEPENDENT_AMBULATORY_CARE_PROVIDER_SITE_OTHER): Payer: 59

## 2020-12-13 ENCOUNTER — Other Ambulatory Visit (HOSPITAL_COMMUNITY)
Admission: RE | Admit: 2020-12-13 | Discharge: 2020-12-13 | Disposition: A | Discharge status: Home / Self Care | Payer: 59 | Source: Ambulatory Visit | Attending: Obstetrics and Gynecology | Admitting: Obstetrics and Gynecology

## 2020-12-13 VITALS — BP 116/72 | HR 68 | Resp 18 | Ht 64.0 in | Wt 290.0 lb

## 2020-12-13 DIAGNOSIS — R102 Pelvic and perineal pain: Secondary | ICD-10-CM | POA: Diagnosis not present

## 2020-12-13 DIAGNOSIS — Z803 Family history of malignant neoplasm of breast: Secondary | ICD-10-CM

## 2020-12-13 DIAGNOSIS — Z6841 Body Mass Index (BMI) 40.0 and over, adult: Secondary | ICD-10-CM

## 2020-12-13 DIAGNOSIS — Z862 Personal history of diseases of the blood and blood-forming organs and certain disorders involving the immune mechanism: Secondary | ICD-10-CM | POA: Diagnosis not present

## 2020-12-13 DIAGNOSIS — N921 Excessive and frequent menstruation with irregular cycle: Secondary | ICD-10-CM | POA: Insufficient documentation

## 2020-12-13 DIAGNOSIS — D251 Intramural leiomyoma of uterus: Secondary | ICD-10-CM | POA: Diagnosis not present

## 2020-12-13 DIAGNOSIS — R7303 Prediabetes: Secondary | ICD-10-CM

## 2020-12-13 NOTE — Addendum Note (Signed)
Addended by: Tobi Bastos on: 12/13/2020 04:29 PM   Modules accepted: Orders

## 2020-12-13 NOTE — Progress Notes (Addendum)
GYNECOLOGY  VISIT   HPI: 40 y.o.   Married Other or two or more races Hispanic or Latino  female   [phone] with Patient's last menstrual period was 11/26/2020.   here for evaluation of AUB and severe cramps that persists with the mirena IUD. Her current episode of bleeding started on 11/26/20, initially saturating a pad an hour, after 12/07/20 she started going through a pad in 3 hours. Cramping is severe at times.  Mirena IUD was inserted 09/10/20. Prior to the IUD she was having heavy bleeding on OCP's, she also needed to come off of OCP's secondary to elevation in her BP.  She has had issues with anemia, 3 months ago her hgb was 7, last month it was up to 12. She has had multiple iron transfusions. She had a normal TSH and negative STD testing in 6/21.  HGBA1C from 6/21 was 5.7 (prediabetic)  No Urinary incontinence, voids frequently, large amounts. Drinks a cup of coffee a day.    GYNECOLOGIC HISTORY: Patient's last menstrual period was 11/26/2020. Contraception:Mirena IUD/BTL Menopausal hormone therapy: none        OB History    Gravida  5   Para  4   Term  4   Preterm  0   AB  1   Living  4     SAB  1   IAB  0   Ectopic  0   Multiple  0   Live Births  4              Patient Active Problem List   Diagnosis Date Noted  . IDA (iron deficiency anemia) 07/17/2020  . H/O tubal ligation 06/29/2018  . Anemia 06/29/2018  . Menometrorrhagia 06/29/2018    Past Medical History:  Diagnosis Date  . Abnormal Pap smear of cervix   . Abnormal uterine bleeding   . Anemia   . Anxiety   . Asthma    in the past  . Depression   . Medical history non-contributory   . Migraine without aura   . Ovarian cyst     Past Surgical History:  Procedure Laterality Date  . CESAREAN SECTION     times 2  . CESAREAN SECTION WITH BILATERAL TUBAL LIGATION    . COLPOSCOPY    . TUBAL LIGATION      Current Outpatient Medications  Medication Sig Dispense Refill  . ibuprofen  (ADVIL) 800 MG tablet Take 1 tablet (800 mg total) by mouth every 8 (eight) hours as needed. 30 tablet 1  . levonorgestrel (MIRENA) 20 MCG/24HR IUD 1 each by Intrauterine route once.    Marland Kitchen oxyCODONE (ROXICODONE) 5 MG immediate release tablet Take 1 tablet (5 mg total) by mouth every 6 (six) hours as needed for severe pain. 10 tablet 0   No current facility-administered medications for this visit.     ALLERGIES: Bee venom and Codeine  Family History  Problem Relation Age of Onset  . Breast cancer Mother   . Hypertension Mother   . Diabetes Father   . Hypertension Father   . Stroke Father   . Heart attack Father   . Miscarriages / Stillbirths Neg Hx   . Obesity Neg Hx   . Vision loss Neg Hx   . Varicose Veins Neg Hx   Mom was ~46 when diagnosed with breast cancer.   Social History   Socioeconomic History  . Marital status: Married    Spouse name: Not on file  . Number of  children: Not on file  . Years of education: Not on file  . Highest education level: Not on file  Occupational History  . Not on file  Tobacco Use  . Smoking status: Former Smoker    Packs/day: 0.50    Types: Cigarettes    Quit date: 07/17/2018    Years since quitting: 2.4  . Smokeless tobacco: Never Used  Vaping Use  . Vaping Use: Never used  Substance and Sexual Activity  . Alcohol use: No  . Drug use: No  . Sexual activity: Yes    Partners: Male    Birth control/protection: Other-see comments    Comment: BTL  Other Topics Concern  . Not on file  Social History Narrative  . Not on file   Social Determinants of Health   Financial Resource Strain: Not on file  Food Insecurity: Not on file  Transportation Needs: Not on file  Physical Activity: Not on file  Stress: Not on file  Social Connections: Not on file  Intimate Partner Violence: Not on file    Review of Systems  Constitutional: Negative.   HENT: Negative.   Eyes: Negative.   Respiratory: Negative.   Cardiovascular: Negative.    Gastrointestinal: Negative.   Genitourinary: Negative.   Musculoskeletal: Negative.   Skin: Negative.   Neurological: Negative.   Endo/Heme/Allergies: Negative.   Psychiatric/Behavioral: Negative.     PHYSICAL EXAMINATION:    BP 116/72 (BP Location: Right Arm, Patient Position: Sitting, Cuff Size: Large)   Pulse 68   Resp 18   Ht 5\' 4"  (1.626 m)   Wt 290 lb (131.5 kg)   LMP 11/26/2020   BMI 49.78 kg/m     General appearance: alert, cooperative and appears stated age Neck: no adenopathy, supple, symmetrical, trachea midline and thyroid normal to inspection and palpation Heart: regular rate and rhythm Lungs: CTAB Abdomen: soft, non-tender; bowel sounds normal; no masses,  no organomegaly Extremities: normal, atraumatic, no cyanosis Skin: normal color, texture and turgor, no rashes or lesions Lymph: normal cervical supraclavicular and inguinal nodes Neurologic: grossly normal    Pelvic: External genitalia:  no lesions              Urethra:  normal appearing urethra with no masses, tenderness or lesions              Bartholins and Skenes: normal                 Vagina: normal appearing vagina with normal color and discharge, no lesions              Cervix: no lesions and IUD string 4 cm              Bimanual Exam:  Uterus:  anteverted, mobile, 8-10 week sized, not tender              Adnexa: no mass, fullness, tenderness              The risks of endometrial biopsy were reviewed and a consent was obtained.  A speculum was placed in the vagina and the cervix was cleansed with betadine. The pipelle was placed into the endometrial cavity. The uterus sounded to 10+ cm. The endometrial biopsy was performed, taking care to get a representative sample, sampling 360 degrees of the uterine cavity. A large amount of blood and tissue was obtained. The speculum were removed. There were no complications.   Chaperone was present for exam.  Ultrasound images were reviewed with the  patient.  She has an enlarged uterus measuring 11.38 x 7.41 x 6.61 cm. Stripe is 4.41 mm and the IUD is in the cavity. There are 3 intramural myomas, the largest is 2.7 cm.   ASSESSMENT Menometrorrhagia, not improved with OCP's, or the mirena IUD Anemia, needing multiple iron transfusions Pelvic pain Fibroid uterus, 3 intramural fibroids, largest 2.7 cm. None of the fibroids impinge on the cavity.  BMI just under 16 FH of breast cancer H/O c/s x 2 Prediabetes    PLAN Endometrial biopsy done Given her uterine size, her irregular cycles, pain and her BMI, I don't think she is a good candidate for OCP's She has failed management with OCP's and the mirena IUD I don't think lysteda will help since it can only be used 5 days a month Depo-provera isn't a good option secondary to the concern of weight gain Barbette Merino is a short term option Discussed hysterectomy as an option, she would like to proceed Discussed total laparoscopic hysterectomy, bilateral salpingectomies and cystoscopy. Reviewed the risks of the procedure, including infection, bleeding, damage to bowel/badder/vessels/ureters.  Discussed the possible need for laparotomy. Discussed post operative recovery and risk of cuff dehiscence. All of her questions were answered ACOG handout on hysterectomy was given  In addition to reviewing the ultrasound and the endometrial biopsy, over 30 minutes was spent in total patient care. Mainly involving counseling and management of her AUB, anemia and pelvic pain.  Addendum: grade 1 cystocele and rectocele with valsalva, no uterine prolapse. No incontinence.

## 2020-12-13 NOTE — Patient Instructions (Addendum)
Hysterectomy Information  A hysterectomy is a surgery to remove your uterus. After surgery, you will no longer have periods. Also, you will no longer be able to get pregnant. Reasons for this surgery You may have this surgery if:  You have bleeding in your vagina: ? That is not normal. ? That does not stop, or that keeps coming back.  You have long-term (chronic) pain in your lower belly (pelvic area).  The lining of your uterus grows outside of the uterus (endometriosis).  The lining of your uterus grows in the muscle of the uterus (adenomyosis).  Your uterus falls down into your vagina (prolapse).  You have a growth in your uterus that causes problems (uterine fibroids).  You have cells that could turn into cancer (precancerous cells).  You have cancer of the uterus or cervix. Types of hysterectomies There are 3 types of hysterectomies. Depending on the type, the surgery will:  Remove the top part of the uterus (supracervical).  Remove the uterus and the cervix (total).  Remove the uterus, cervix, and tissue that holds the uterus in place (radical). Ways a hysterectomy can be done This surgery may be done in one of these ways:  A cut (incision) is made in the belly (abdomen). The uterus is taken out through the cut.  A cut is made in the vagina. The uterus is taken out through the cut.  Three or four cuts are made in the belly. A device with a camera is put through one of the cuts. The uterus is cut into pieces and taken out through the cuts or the vagina.  Three or four cuts are made in the belly. A device with a camera is put through one of the cuts. The uterus is taken out through the vagina.  Three or four cuts are made in the belly. A computer helps control the surgical tools. The uterus is cut into small pieces. The pieces are taken out through the cuts or through the vagina. Talk with your doctor about which way is best for you. Risks of hysterectomy Generally,  this surgery is safe. However, problems can happen, including:  Bleeding.  Needing donated blood (transfusion).  Blood clots.  Infection.  Damage to other structures or organs.  Allergic reactions.  Needing to switch to a different type of surgery. What to expect after surgery  You will be given pain medicine.  You will need to stay in the hospital for 1-2 days.  Follow your doctor's instructions about: ? Exercising. ? Driving. ? What activities are safe for you.  You will need to have someone with you at home for 3-5 days.  You will need to see your doctor after 2-4 weeks.  You may get hot flashes, have night sweats, and have trouble sleeping.  You may need to have Pap tests if your surgery was related to cancer. Talk with your doctor about how often you need Pap tests. Questions to ask your doctor  Do I need this surgery? Do I have other treatment options?  What are my options for this surgery?  What needs to be removed?  What are the risks?  What are the benefits?  How long will I need to stay in the hospital?  How long will I need to recover?  What symptoms can I expect after the procedure? Summary  A hysterectomy is a surgery to remove your uterus. After surgery, you will no longer have periods. Also, you will no longer be able to   get pregnant.  Talk with your doctor about which type of hysterectomy is best for you. This information is not intended to replace advice given to you by your health care provider. Make sure you discuss any questions you have with your health care provider. Document Revised: 01/27/2019 Document Reviewed: 02/24/2017 Elsevier Patient Education  2020 Else[employer] Inc.  Endometrial Biopsy Post-procedure Instructions . Cramping is common.  You may take Ibuprofen, Aleve, or Tylenol for the cramping.  This should resolve within 24 hours.   . You may have a small amount of spotting.  You should wear a mini pad for the next few  days. . You may have intercourse in 24 hours. . You need to call the office if you have any pelvic pain, fever, heavy bleeding, or foul smelling vaginal discharge. . Shower or bathe as normal . You will be notified within one week of your biopsy results or we will discuss your results at your follow-up appointment if needed.

## 2020-12-14 ENCOUNTER — Telehealth: Payer: Self-pay | Admitting: Genetic Counselor

## 2020-12-14 ENCOUNTER — Telehealth: Payer: Self-pay

## 2020-12-14 NOTE — Telephone Encounter (Signed)
Mr. Terri Walker is calling in voice mail on behalf of patient stating she does not have a voice today. He said he is returning [place] call.

## 2020-12-14 NOTE — Telephone Encounter (Signed)
Received a genetic counseling referral from Dr. [name] for fhx of breast cancer. Ms. Terri Walker has been cld and scheduled for a mychart video visit on 1/25 at 1pm to see Santiago Glad. I verified that the pt has an active mychart acct.

## 2020-12-17 LAB — SURGICAL PATHOLOGY

## 2020-12-17 NOTE — Telephone Encounter (Signed)
Patient left a message stating she is returning a call to [place] from 12/15/19.

## 2020-12-19 NOTE — Telephone Encounter (Signed)
Spoke with patient regarding surgery benefits. Patient acknowledges understanding of information presented. Patient is aware that benefits presented are for professional benefits only. Patient is aware that once surgery is scheduled, the hospital will call with separate benefits. Patient is aware of surgery cancellation policy.  Patient is ready to proceed with scheduling. Patient requesting note for work, once surgery and appointments are scheduled, with dates, to give to her employer.  Routing to [employer], Therapist, sports, to proceed with scheduling.

## 2020-12-27 ENCOUNTER — Ambulatory Visit (INDEPENDENT_AMBULATORY_CARE_PROVIDER_SITE_OTHER): Payer: 59 | Admitting: Obstetrics and Gynecology

## 2020-12-27 ENCOUNTER — Other Ambulatory Visit: Payer: Self-pay

## 2020-12-27 ENCOUNTER — Encounter: Payer: Self-pay | Admitting: Obstetrics and Gynecology

## 2020-12-27 VITALS — BP 128/80 | HR 84 | Ht 64.0 in | Wt 290.0 lb

## 2020-12-27 DIAGNOSIS — Z01818 Encounter for other preprocedural examination: Secondary | ICD-10-CM

## 2020-12-27 NOTE — Telephone Encounter (Signed)
Spoke with patient, reviewed potential surgery dates. Patient request to proceed with surgery on 01/28/21 at [facility] scheduled for today at 3:15pm to review and sign medicaid sterilization form. Advised patient I will f/u once surgery date confirmed. Patient verbalizes understanding and is agreeable.

## 2020-12-27 NOTE — Progress Notes (Signed)
GYNECOLOGY  VISIT   HPI: 40 y.o.   Married Other or two or more races Hispanic or Latino  female   [phone] with Patient's last menstrual period was 11/26/2020.   here for a pre op consent for sterilization. She has already had a tubal ligation and is absolutely certain she doesn't want any more children.  GYNECOLOGIC HISTORY: Patient's last menstrual period was 11/26/2020. Contraception: tubal ligation Menopausal hormone therapy: none        OB History    Gravida  5   Para  4   Term  4   Preterm  0   AB  1   Living  4     SAB  1   IAB  0   Ectopic  0   Multiple  0   Live Births  4              Patient Active Problem List   Diagnosis Date Noted  . IDA (iron deficiency anemia) 07/17/2020  . H/O tubal ligation 06/29/2018  . Anemia 06/29/2018  . Menometrorrhagia 06/29/2018    Past Medical History:  Diagnosis Date  . Abnormal Pap smear of cervix   . Abnormal uterine bleeding   . Anemia   . Anxiety   . Asthma    in the past  . Depression   . Medical history non-contributory   . Migraine without aura   . Ovarian cyst     Past Surgical History:  Procedure Laterality Date  . CESAREAN SECTION     times 2  . CESAREAN SECTION WITH BILATERAL TUBAL LIGATION    . COLPOSCOPY    . TUBAL LIGATION      Current Outpatient Medications  Medication Sig Dispense Refill  . ibuprofen (ADVIL) 800 MG tablet Take 1 tablet (800 mg total) by mouth every 8 (eight) hours as needed. 30 tablet 1  . levonorgestrel (MIRENA) 20 MCG/24HR IUD 1 each by Intrauterine route once.    Marland Kitchen oxyCODONE (ROXICODONE) 5 MG immediate release tablet Take 1 tablet (5 mg total) by mouth every 6 (six) hours as needed for severe pain. 10 tablet 0   No current facility-administered medications for this visit.     ALLERGIES: Bee venom and Codeine  Family History  Problem Relation Age of Onset  . Breast cancer Mother   . Hypertension Mother   . Diabetes Father   . Hypertension Father   .  Stroke Father   . Heart attack Father   . Miscarriages / Stillbirths Neg Hx   . Obesity Neg Hx   . Vision loss Neg Hx   . Varicose Veins Neg Hx     Social History   Socioeconomic History  . Marital status: Married    Spouse name: Not on file  . Number of children: Not on file  . Years of education: Not on file  . Highest education level: Not on file  Occupational History  . Not on file  Tobacco Use  . Smoking status: Former Smoker    Packs/day: 0.50    Types: Cigarettes    Quit date: 07/17/2018    Years since quitting: 2.4  . Smokeless tobacco: Never Used  Vaping Use  . Vaping Use: Never used  Substance and Sexual Activity  . Alcohol use: No  . Drug use: No  . Sexual activity: Yes    Partners: Male    Birth control/protection: Other-see comments, I.U.D.    Comment: BTL/Mirena  Other Topics Concern  .  Not on file  Social History Narrative  . Not on file   Social Determinants of Health   Financial Resource Strain: Not on file  Food Insecurity: Not on file  Transportation Needs: Not on file  Physical Activity: Not on file  Stress: Not on file  Social Connections: Not on file  Intimate Partner Violence: Not on file    Review of Systems  Constitutional: Negative.   HENT: Negative.   Eyes: Negative.   Respiratory: Negative.   Cardiovascular: Negative.   Gastrointestinal: Negative.   Genitourinary: Negative.   Musculoskeletal: Negative.   Skin: Negative.   Neurological: Negative.   Endo/Heme/Allergies: Negative.   Psychiatric/Behavioral: Negative.     PHYSICAL EXAMINATION:    BP 128/80 (BP Location: Left Arm, Patient Position: Sitting, Cuff Size: Large)   Pulse 84   Ht 5\' 4"  (1.626 m)   Wt 290 lb (131.5 kg)   LMP 11/26/2020   BMI 49.78 kg/m     General appearance: alert, cooperative and appears stated age  A/P reviewed with the patient that total laparoscopic hysterectomy is a form of sterilization. She understands she will not be able to get  pregnant. She has no questions. Consent was signed.

## 2020-12-31 NOTE — Telephone Encounter (Signed)
Spoke with patient. Surgery date request confirmed.  Advised surgery is scheduled for 01/28/21 at [facility] Surgery instruction sheet and hospital brochure reviewed, printed copy will be picked up in office at pre-op visit on 01/04/21. Patient advised of Covid screening and quarantine requirements and agreeable.   Routing to provider. Encounter closed.   Cc: Hayley Carder

## 2021-01-02 ENCOUNTER — Inpatient Hospital Stay: Payer: 59 | Admitting: Licensed Clinical Social Worker

## 2021-01-02 NOTE — H&P (View-Only) (Signed)
GYNECOLOGY  VISIT   HPI: 40 y.o.   Married Other or two or more races Hispanic or Latino  female   [phone] with No LMP recorded. (Menstrual status: IUD).   here for pre-op.    The patient was started on OCP's in 7/21 for menorrhagia leading to anemia and cramps. She continued to have abnormal bleeding on OCP's. In 10/21 she had a mirena IUD inserted. She has continued to bleed heavily and have severe cramps.  She has been getting iron transfusions with hematology. In October, 2021 her Hgb was 7. Most recent Hgb in 12/21 was 12.  Endometrial biopsy from 12/13/20: inactive endometrium with progestational effects, no atypia or hyperplasia.   Ultrasound from 12/13/20: Findings:  Uterus 11.38 x 7.41 x 6.61 cm, anteverted  Fibroids: Fibroid 1 2.71 x 1.86 cm, intramural Fibroid 2 1.21 x 0.91 cm, intramural Fibroid 3 0.78 x 0.52 cm, intramural  Endometrium 4.41 mm, IUD located within the endometrial canal  Left ovary 5.88 x 3.63 x 4.54 cm, normal blood flow  Right ovary 4.06 x 2.56 x 2.64 cm, 3.4 cm simple cyst noted. Normal blood flow  No free fluid   Impression:  Enlarged uterus with 3 intramural myomas Thin symmetrical endometrium IUD in the endometrial cavity Right ovary with 3.4 cm simple cyst Normal left ovary   GYNECOLOGIC HISTORY: No LMP recorded. (Menstrual status: IUD). Contraception:Tubal Ligation Menopausal hormone therapy: none        OB History    Gravida  5   Para  4   Term  4   Preterm  0   AB  1   Living  4     SAB  1   IAB  0   Ectopic  0   Multiple  0   Live Births  4              Patient Active Problem List   Diagnosis Date Noted  . IDA (iron deficiency anemia) 07/17/2020  . H/O tubal ligation 06/29/2018  . Anemia 06/29/2018  . Menometrorrhagia 06/29/2018    Past Medical History:  Diagnosis Date  . Abnormal Pap smear of cervix   . Abnormal uterine bleeding   . Anemia   . Anxiety   . Asthma    in the past  .  Depression   . Medical history non-contributory   . Migraine without aura   . Ovarian cyst     Past Surgical History:  Procedure Laterality Date  . CESAREAN SECTION     times 2  . CESAREAN SECTION WITH BILATERAL TUBAL LIGATION    . COLPOSCOPY    . TUBAL LIGATION      Current Outpatient Medications  Medication Sig Dispense Refill  . ibuprofen (ADVIL) 800 MG tablet Take 1 tablet (800 mg total) by mouth every 8 (eight) hours as needed. 30 tablet 1  . levonorgestrel (MIRENA) 20 MCG/24HR IUD 1 each by Intrauterine route once.    Marland Kitchen oxyCODONE (ROXICODONE) 5 MG immediate release tablet Take 1 tablet (5 mg total) by mouth every 6 (six) hours as needed for severe pain. 10 tablet 0   No current facility-administered medications for this visit.     ALLERGIES: Bee venom  Family History  Problem Relation Age of Onset  . Breast cancer Mother   . Hypertension Mother   . Diabetes Father   . Hypertension Father   . Stroke Father   . Heart attack Father   . Miscarriages / Stillbirths Neg Hx   .  Obesity Neg Hx   . Vision loss Neg Hx   . Varicose Veins Neg Hx     Social History   Socioeconomic History  . Marital status: Married    Spouse name: Not on file  . Number of children: Not on file  . Years of education: Not on file  . Highest education level: Not on file  Occupational History  . Not on file  Tobacco Use  . Smoking status: Former Smoker    Packs/day: 0.50    Types: Cigarettes    Quit date: 07/17/2018    Years since quitting: 2.4  . Smokeless tobacco: Never Used  Vaping Use  . Vaping Use: Never used  Substance and Sexual Activity  . Alcohol use: No  . Drug use: No  . Sexual activity: Yes    Partners: Male    Birth control/protection: Other-see comments, I.U.D.    Comment: BTL/Mirena  Other Topics Concern  . Not on file  Social History Narrative  . Not on file   Social Determinants of Health   Financial Resource Strain: Not on file  Food Insecurity: Not on  file  Transportation Needs: Not on file  Physical Activity: Not on file  Stress: Not on file  Social Connections: Not on file  Intimate Partner Violence: Not on file    Review of Systems  Constitutional: Negative.   HENT: Negative.   Eyes: Negative.   Respiratory: Negative.   Cardiovascular: Negative.   Gastrointestinal: Negative.   Genitourinary:       Irregular cycle  Musculoskeletal: Negative.   Skin: Negative.   Neurological: Negative.   Psychiatric/Behavioral: Negative.     PHYSICAL EXAMINATION:    BP 120/82   Pulse 68   Resp 16   Wt 293 lb (132.9 kg)   BMI 50.29 kg/m     General appearance: alert, cooperative and appears stated age Neck: no adenopathy, supple, symmetrical, trachea midline and thyroid normal to inspection and palpation Heart: regular rate and rhythm Lungs: CTAB Abdomen: soft, non-tender; bowel sounds normal; no masses,  no organomegaly Extremities: normal, atraumatic, no cyanosis Skin: normal color, texture and turgor, no rashes or lesions Lymph: normal cervical supraclavicular and inguinal nodes Neurologic: grossly normal  Pelvic exam from 12/13/20: Pelvic: External genitalia:  no lesions              Urethra:  normal appearing urethra with no masses, tenderness or lesions              Bartholins and Skenes: normal                 Vagina: normal appearing vagina with normal color and discharge, no lesions              Cervix: no lesions and IUD string 4 cm              Bimanual Exam:  Uterus:  anteverted, mobile, 8-10 week sized, not tender              Adnexa: no mass, fullness, tenderness   1. Menometrorrhagia Not controlled with OCP's or mirena IUD. Desires definitive treatment.  Discussed total laparoscopic hysterectomy, bilateral salpingectomies and cystoscopy. Reviewed the risks of the procedure, including infection, bleeding, damage to bowel/badder/vessels/ureters. Discussed post operative recovery and risk of cuff dehiscence. All of  her questions were answered   2. Pelvic pain   3. History of anemia Being managed by hematology with iron transfusions   4. Intramural leiomyoma of uterus  5. Prediabetes HgbA1C from 06/01/20 was 5.7 Will repeat with preop labs.  6. Severe dysmenorrhea   7. BMI 50.0-59.9, adult [facility]) Surgery will be at [facility]

## 2021-01-02 NOTE — Progress Notes (Signed)
GYNECOLOGY  VISIT   HPI: 40 y.o.   Married Other or two or more races Hispanic or Latino  female   [phone] with No LMP recorded. (Menstrual status: IUD).   here for pre-op.    The patient was started on OCP's in 7/21 for menorrhagia leading to anemia and cramps. She continued to have abnormal bleeding on OCP's. In 10/21 she had a mirena IUD inserted. She has continued to bleed heavily and have severe cramps.  She has been getting iron transfusions with hematology. In October, 2021 her Hgb was 7. Most recent Hgb in 12/21 was 12.  Endometrial biopsy from 12/13/20: inactive endometrium with progestational effects, no atypia or hyperplasia.   Ultrasound from 12/13/20: Findings:  Uterus 11.38 x 7.41 x 6.61 cm, anteverted  Fibroids: Fibroid 1 2.71 x 1.86 cm, intramural Fibroid 2 1.21 x 0.91 cm, intramural Fibroid 3 0.78 x 0.52 cm, intramural  Endometrium 4.41 mm, IUD located within the endometrial canal  Left ovary 5.88 x 3.63 x 4.54 cm, normal blood flow  Right ovary 4.06 x 2.56 x 2.64 cm, 3.4 cm simple cyst noted. Normal blood flow  No free fluid   Impression:  Enlarged uterus with 3 intramural myomas Thin symmetrical endometrium IUD in the endometrial cavity Right ovary with 3.4 cm simple cyst Normal left ovary   GYNECOLOGIC HISTORY: No LMP recorded. (Menstrual status: IUD). Contraception:Tubal Ligation Menopausal hormone therapy: none        OB History    Gravida  5   Para  4   Term  4   Preterm  0   AB  1   Living  4     SAB  1   IAB  0   Ectopic  0   Multiple  0   Live Births  4              Patient Active Problem List   Diagnosis Date Noted  . IDA (iron deficiency anemia) 07/17/2020  . H/O tubal ligation 06/29/2018  . Anemia 06/29/2018  . Menometrorrhagia 06/29/2018    Past Medical History:  Diagnosis Date  . Abnormal Pap smear of cervix   . Abnormal uterine bleeding   . Anemia   . Anxiety   . Asthma    in the past  .  Depression   . Medical history non-contributory   . Migraine without aura   . Ovarian cyst     Past Surgical History:  Procedure Laterality Date  . CESAREAN SECTION     times 2  . CESAREAN SECTION WITH BILATERAL TUBAL LIGATION    . COLPOSCOPY    . TUBAL LIGATION      Current Outpatient Medications  Medication Sig Dispense Refill  . ibuprofen (ADVIL) 800 MG tablet Take 1 tablet (800 mg total) by mouth every 8 (eight) hours as needed. 30 tablet 1  . levonorgestrel (MIRENA) 20 MCG/24HR IUD 1 each by Intrauterine route once.    Marland Kitchen oxyCODONE (ROXICODONE) 5 MG immediate release tablet Take 1 tablet (5 mg total) by mouth every 6 (six) hours as needed for severe pain. 10 tablet 0   No current facility-administered medications for this visit.     ALLERGIES: Bee venom  Family History  Problem Relation Age of Onset  . Breast cancer Mother   . Hypertension Mother   . Diabetes Father   . Hypertension Father   . Stroke Father   . Heart attack Father   . Miscarriages / Stillbirths Neg Hx   .  Obesity Neg Hx   . Vision loss Neg Hx   . Varicose Veins Neg Hx     Social History   Socioeconomic History  . Marital status: Married    Spouse name: Not on file  . Number of children: Not on file  . Years of education: Not on file  . Highest education level: Not on file  Occupational History  . Not on file  Tobacco Use  . Smoking status: Former Smoker    Packs/day: 0.50    Types: Cigarettes    Quit date: 07/17/2018    Years since quitting: 2.4  . Smokeless tobacco: Never Used  Vaping Use  . Vaping Use: Never used  Substance and Sexual Activity  . Alcohol use: No  . Drug use: No  . Sexual activity: Yes    Partners: Male    Birth control/protection: Other-see comments, I.U.D.    Comment: BTL/Mirena  Other Topics Concern  . Not on file  Social History Narrative  . Not on file   Social Determinants of Health   Financial Resource Strain: Not on file  Food Insecurity: Not on  file  Transportation Needs: Not on file  Physical Activity: Not on file  Stress: Not on file  Social Connections: Not on file  Intimate Partner Violence: Not on file    Review of Systems  Constitutional: Negative.   HENT: Negative.   Eyes: Negative.   Respiratory: Negative.   Cardiovascular: Negative.   Gastrointestinal: Negative.   Genitourinary:       Irregular cycle  Musculoskeletal: Negative.   Skin: Negative.   Neurological: Negative.   Psychiatric/Behavioral: Negative.     PHYSICAL EXAMINATION:    BP 120/82   Pulse 68   Resp 16   Wt 293 lb (132.9 kg)   BMI 50.29 kg/m     General appearance: alert, cooperative and appears stated age Neck: no adenopathy, supple, symmetrical, trachea midline and thyroid normal to inspection and palpation Heart: regular rate and rhythm Lungs: CTAB Abdomen: soft, non-tender; bowel sounds normal; no masses,  no organomegaly Extremities: normal, atraumatic, no cyanosis Skin: normal color, texture and turgor, no rashes or lesions Lymph: normal cervical supraclavicular and inguinal nodes Neurologic: grossly normal  Pelvic exam from 12/13/20: Pelvic: External genitalia:  no lesions              Urethra:  normal appearing urethra with no masses, tenderness or lesions              Bartholins and Skenes: normal                 Vagina: normal appearing vagina with normal color and discharge, no lesions              Cervix: no lesions and IUD string 4 cm              Bimanual Exam:  Uterus:  anteverted, mobile, 8-10 week sized, not tender              Adnexa: no mass, fullness, tenderness   1. Menometrorrhagia Not controlled with OCP's or mirena IUD. Desires definitive treatment.  Discussed total laparoscopic hysterectomy, bilateral salpingectomies and cystoscopy. Reviewed the risks of the procedure, including infection, bleeding, damage to bowel/badder/vessels/ureters. Discussed post operative recovery and risk of cuff dehiscence. All of  her questions were answered   2. Pelvic pain   3. History of anemia Being managed by hematology with iron transfusions   4. Intramural leiomyoma of uterus  5. Prediabetes HgbA1C from 06/01/20 was 5.7 Will repeat with preop labs.  6. Severe dysmenorrhea   7. BMI 50.0-59.9, adult [facility]) Surgery will be at [facility]

## 2021-01-04 ENCOUNTER — Ambulatory Visit (INDEPENDENT_AMBULATORY_CARE_PROVIDER_SITE_OTHER): Payer: 59 | Admitting: Obstetrics and Gynecology

## 2021-01-04 ENCOUNTER — Encounter: Payer: Self-pay | Admitting: Obstetrics and Gynecology

## 2021-01-04 ENCOUNTER — Other Ambulatory Visit: Payer: Self-pay

## 2021-01-04 ENCOUNTER — Inpatient Hospital Stay: Payer: 59 | Attending: Family | Admitting: Licensed Clinical Social Worker

## 2021-01-04 ENCOUNTER — Encounter: Payer: Self-pay | Admitting: Licensed Clinical Social Worker

## 2021-01-04 VITALS — BP 120/82 | HR 68 | Resp 16 | Wt 293.0 lb

## 2021-01-04 DIAGNOSIS — Z6841 Body Mass Index (BMI) 40.0 and over, adult: Secondary | ICD-10-CM

## 2021-01-04 DIAGNOSIS — Z803 Family history of malignant neoplasm of breast: Secondary | ICD-10-CM | POA: Diagnosis not present

## 2021-01-04 DIAGNOSIS — R102 Pelvic and perineal pain: Secondary | ICD-10-CM

## 2021-01-04 DIAGNOSIS — N921 Excessive and frequent menstruation with irregular cycle: Secondary | ICD-10-CM

## 2021-01-04 DIAGNOSIS — Z862 Personal history of diseases of the blood and blood-forming organs and certain disorders involving the immune mechanism: Secondary | ICD-10-CM

## 2021-01-04 DIAGNOSIS — R7303 Prediabetes: Secondary | ICD-10-CM

## 2021-01-04 DIAGNOSIS — D251 Intramural leiomyoma of uterus: Secondary | ICD-10-CM

## 2021-01-04 DIAGNOSIS — N946 Dysmenorrhea, unspecified: Secondary | ICD-10-CM

## 2021-01-04 NOTE — Progress Notes (Signed)
REFERRING PROVIDER: Salvadore Dom, [address],  [place] [address]  PRIMARY PROVIDER:  Patient, No Pcp Per  PRIMARY REASON FOR VISIT:  1. Family history of breast cancer     I connected with Ms. Emert on 01/04/2021 at 1:25 PM EDT by MyChart video conference and verified that I am speaking with the correct person using two identifiers.    Patient location: home Provider location: [place]:   Ms. Viereck, a 40 y.o. female, was seen for a [facility] cancer genetics consultation at the request of Dr. [name] due to a family history of cancer.  Ms. Zima presents to clinic today to discuss the possibility of a hereditary predisposition to cancer, genetic testing, and to further clarify her future cancer risks, as well as potential cancer risks for family members.    Ms. Biddle is a 40 y.o. female with no personal history of cancer.    CANCER HISTORY:  Oncology History   No history exists.     RISK FACTORS:  Menarche was at age 22.  First live birth at age 44.  OCP use for approximately 2 years.  Ovaries intact: yes.  Hysterectomy: no.  Menopausal status: premenopausal.  HRT use: 0 years. Colonoscopy: no; not examined. Mammogram within the last year: yes. Number of breast biopsies: 0. Up to date with pelvic exams: yes. Any excessive radiation exposure in the past: no  Past Medical History:  Diagnosis Date  . Abnormal Pap smear of cervix   . Abnormal uterine bleeding   . Anemia   . Anxiety   . Asthma    in the past  . Depression   . Family history of breast cancer   . Medical history non-contributory   . Migraine without aura   . Ovarian cyst     Past Surgical History:  Procedure Laterality Date  . CESAREAN SECTION     times 2  . CESAREAN SECTION WITH BILATERAL TUBAL LIGATION    . COLPOSCOPY    . TUBAL LIGATION      Social History   Socioeconomic History   . Marital status: Married    Spouse name: Not on file  . Number of children: Not on file  . Years of education: Not on file  . Highest education level: Not on file  Occupational History  . Not on file  Tobacco Use  . Smoking status: Former Smoker    Packs/day: 0.50    Types: Cigarettes    Quit date: 07/17/2018    Years since quitting: 2.4  . Smokeless tobacco: Never Used  Vaping Use  . Vaping Use: Never used  Substance and Sexual Activity  . Alcohol use: No  . Drug use: No  . Sexual activity: Yes    Partners: Male    Birth control/protection: Other-see comments, I.U.D.    Comment: BTL/Mirena  Other Topics Concern  . Not on file  Social History Narrative  . Not on file   Social Determinants of Health   Financial Resource Strain: Not on file  Food Insecurity: Not on file  Transportation Needs: Not on file  Physical Activity: Not on file  Stress: Not on file  Social Connections: Not on file     FAMILY HISTORY:  We obtained a detailed, 4-generation family history.  Significant diagnoses are listed below: Family History  Problem Relation Age of Onset  . Breast cancer Mother  early 75s, no GT  . Hypertension Mother   . Diabetes Father   . Hypertension Father   . Stroke Father   . Heart attack Father   . Cancer Paternal Uncle        unk type  . Miscarriages / Stillbirths Neg Hx   . Obesity Neg Hx   . Vision loss Neg Hx   . Varicose Veins Neg Hx    Ms. Medina-Barrios has 2 daughters and 2 sons, no cancers. She has two full brothers, 1 full sister and 1 maternal half-brother, no cancers. She does not have contact with her siblings or relatives.   Ms. Hattabaugh mother was diagnosed with breast cancer in her early 58s and is living in her 35s. She has not had genetic testing that the patient is aware of. Patient has no other information about maternal side of the family.  Ms. Rachel Moulds father died at 39 of a stroke, no history of cancer. Patient  had approximately 4 paternal uncles, and one had cancer but she is unsure type. No other information known about this side.   Ms. Bayon is unaware of previous family history of genetic testing for hereditary cancer risks. Patient's maternal ancestors are of [place] descent, and paternal ancestors are of [place] descent. There is no reported Ashkenazi Jewish ancestry. There is no known consanguinity.  GENETIC COUNSELING ASSESSMENT: Ms. Urbanik is a 40 y.o. female with a family history of breast cancer which is somewhat suggestive of a hereditary cancer syndrome and predisposition to cancer. We, therefore, discussed and recommended the following at today's visit.   DISCUSSION: We discussed that approximately 5-10% of breast cancer is hereditary  Most cases of hereditary breast cancer are associated with BRCA1/BRCA2 genes, although there are other genes associated with hereditary cancer as well. We discussed that testing is beneficial for several reasons including knowing about other cancer risks, identifying potential screening and risk-reduction options that may be appropriate, and to understand if other family members could be at risk for cancer and allow them to undergo genetic testing.   We reviewed the characteristics, features and inheritance patterns of hereditary cancer syndromes. We also discussed genetic testing, including the appropriate family members to test, the process of testing, insurance coverage and turn-around-time for results. We discussed the implications of a negative, positive and/or variant of uncertain significant result. We recommended Ms. Medina-Barrios pursue genetic testing for the [facility] CancerNext-Expanded gene panel.   The CancerNext-Expanded + RNAinsight gene panel offered by [employer] and includes sequencing and rearrangement analysis for the following 77 genes: IP, ALK, APC*, ATM*, AXIN2, BAP1, BARD1, BLM, BMPR1A, BRCA1*, BRCA2*, BRIP1*, CDC73,  CDH1*,CDK4, CDKN1B, CDKN2A, CHEK2*, CTNNA1, DICER1, FANCC, FH, FLCN, GALNT12, KIF1B, LZTR1, MAX, MEN1, MET, MLH1*, MSH2*, MSH3, MSH6*, MUTYH*, NBN, NF1*, NF2, NTHL1, PALB2*, PHOX2B, PMS2*, POT1, PRKAR1A, PTCH1, PTEN*, RAD51C*, RAD51D*,RB1, RECQL, RET, SDHA, SDHAF2, SDHB, SDHC, SDHD, SMAD4, SMARCA4, SMARCB1, SMARCE1, STK11, SUFU, TMEM127, TP53*,TSC1, TSC2, VHL and XRCC2 (sequencing and deletion/duplication); EGFR, EGLN1, HOXB13, KIT, MITF, PDGFRA, POLD1 and POLE (sequencing only); EPCAM and GREM1 (deletion/duplication only).   Based on Ms. Medina-Barrios's family history of cancer, she meets medical criteria for genetic testing. Despite that she meets criteria, she may still have an out of pocket cost. We discussed that if her out of pocket cost for testing is over $100, the laboratory will call and confirm whether she wants to proceed with testing.  If the out of pocket cost of testing is less than $100 she will be  billed by the genetic testing laboratory.   PLAN: After considering the risks, benefits, and limitations, Ms. Dura provided informed consent to pursue genetic testing. A saliva sample was mailed to her and the sample will be sent to [facility] for analysis of the CancerNext-Expanded panel. Results should be available within approximately 2-3 weeks' time, at which point they will be disclosed by telephone to Ms. Medina-Barrios, as will any additional recommendations warranted by these results. Ms. Mcgriff will receive a summary of her genetic counseling visit and a copy of her results once available. This information will also be available in Epic.   Ms. Dulany questions were answered to her satisfaction today. Our contact information was provided should additional questions or concerns arise. Thank you for the referral and allowing [place] to share in the care of your patient.   Faith Rogue, MS, [facility] Genetic Counselor [email or web address] Phone:  [phone]  The patient was seen for a total of 25 minutes in face-to-face genetic counseling.  Dr. Grayland Ormond was available for discussion regarding this case.   _______________________________________________________________________ For Office Staff:  Number of people involved in session: 1 Was an Intern/ student involved with case: no

## 2021-01-22 NOTE — Progress Notes (Signed)
.Surgical Instructions    Your procedure is scheduled on 01/28/21.  Report to [facility] Main Entrance "A" at 09:30 A.M., then check in with the Admitting office.  Call this number if you have problems the morning of surgery:  [phone]   If you have any questions prior to your surgery date call [phone]: Open Monday-Friday 8am-4pm    Remember:  Do not eat after midnight the night before your surgery  You may drink clear liquids until 08:30am the morning of your surgery.   Clear liquids allowed are: Water, Non-Citrus Juices (without pulp), Carbonated Beverages, Clear Tea, Black Coffee Only, and Gatorade  Patient Instructions  . The night before surgery:  o No food after midnight. ONLY clear liquids after midnight  . The day of surgery (if you do NOT have diabetes):  o Drink ONE (1) Pre-Surgery Clear Ensure by 8:30am. Drink in one sitting. Do not sip.  o This drink was given to you during your hospital  pre-op appointment visit. o Nothing else to drink after completing the  Pre-Surgery Clear Ensure.      Take these medicines the morning of surgery with A SIP OF WATER: NONE   As of today, STOP taking any Aspirin (unless otherwise instructed by your surgeon) Aleve, Naproxen, Ibuprofen, Motrin, Advil, Goody's, BC's, all herbal medications, fish oil, and all vitamins.                     Do not wear jewelry, make up, or nail polish            Do not wear lotions, powders, perfumes/colognes, or deodorant.            Do not shave 48 hours prior to surgery.             Do not bring valuables to the hospital.            [facility] is not responsible for any belongings or valuables.  Do NOT Smoke (Tobacco/Vaping) or drink Alcohol 24 hours prior to your procedure If you use a CPAP at night, you may bring all equipment for your overnight stay.   Contacts, glasses, dentures or bridgework may not be worn into surgery, please bring cases for these belongings   For patients  admitted to the hospital, discharge time will be determined by your treatment team.   Patients discharged the day of surgery will not be allowed to drive home, and someone needs to stay with them for 24 hours.    Special instructions:   [place]- Preparing For Surgery  Before surgery, you can play an important role. Because skin is not sterile, your skin needs to be as free of germs as possible. You can reduce the number of germs on your skin by washing with CHG (chlorahexidine gluconate) Soap before surgery.  CHG is an antiseptic cleaner which kills germs and bonds with the skin to continue killing germs even after washing.    Oral Hygiene is also important to reduce your risk of infection.  Remember - BRUSH YOUR TEETH THE MORNING OF SURGERY WITH YOUR REGULAR TOOTHPASTE  Please do not use if you have an allergy to CHG or antibacterial soaps. If your skin becomes reddened/irritated stop using the CHG.  Do not shave (including legs and underarms) for at least 48 hours prior to first CHG shower. It is OK to shave your face.  Please follow these instructions carefully.   1. Shower the NIGHT BEFORE SURGERY and  the MORNING OF SURGERY  2. If you chose to wash your hair, wash your hair first as usual with your normal shampoo.  3. After you shampoo, rinse your hair and body thoroughly to remove the shampoo.  4. Wash Face and genitals (private parts) with your normal soap.   5.  Shower the NIGHT BEFORE SURGERY and the MORNING OF SURGERY with CHG Soap.   6. Use CHG Soap as you would any other liquid soap. You can apply CHG directly to the skin and wash gently with a scrungie or a clean washcloth.   7. Apply the CHG Soap to your body ONLY FROM THE NECK DOWN.  Do not use on open wounds or open sores. Avoid contact with your eyes, ears, mouth and genitals (private parts). Wash Face and genitals (private parts)  with your normal soap.   8. Wash thoroughly, paying special attention to the area  where your surgery will be performed.  9. Thoroughly rinse your body with warm water from the neck down.  10. DO NOT shower/wash with your normal soap after using and rinsing off the CHG Soap.  11. Pat yourself dry with a CLEAN TOWEL.  12. Wear CLEAN PAJAMAS to bed the night before surgery  13. Place CLEAN SHEETS on your bed the night before your surgery  14. DO NOT SLEEP WITH PETS.   Day of Surgery: Take a shower. Wear Clean/Comfortable clothing the morning of surgery Do not apply any deodorants/lotions.   Remember to brush your teeth WITH YOUR REGULAR TOOTHPASTE.   Please read over the following fact sheets that you were given.

## 2021-01-23 ENCOUNTER — Other Ambulatory Visit: Payer: Self-pay

## 2021-01-23 ENCOUNTER — Encounter (HOSPITAL_COMMUNITY): Payer: Self-pay

## 2021-01-23 ENCOUNTER — Encounter (HOSPITAL_COMMUNITY)
Admission: RE | Admit: 2021-01-23 | Discharge: 2021-01-23 | Disposition: A | Discharge status: Home / Self Care | Payer: 59 | Source: Ambulatory Visit | Attending: Obstetrics and Gynecology | Admitting: Obstetrics and Gynecology

## 2021-01-23 DIAGNOSIS — Z01812 Encounter for preprocedural laboratory examination: Secondary | ICD-10-CM | POA: Insufficient documentation

## 2021-01-23 LAB — CBC WITH DIFFERENTIAL/PLATELET
Abs Immature Granulocytes: 0.02 10*3/uL (ref 0.00–0.07)
Basophils Absolute: 0 10*3/uL (ref 0.0–0.1)
Basophils Relative: 0 %
Eosinophils Absolute: 0.2 10*3/uL (ref 0.0–0.5)
Eosinophils Relative: 3 %
HCT: 44.1 % (ref 36.0–46.0)
Hemoglobin: 13.5 g/dL (ref 12.0–15.0)
Immature Granulocytes: 0 %
Lymphocytes Relative: 29 %
Lymphs Abs: 2.5 10*3/uL (ref 0.7–4.0)
MCH: 26.4 pg (ref 26.0–34.0)
MCHC: 30.6 g/dL (ref 30.0–36.0)
MCV: 86.3 fL (ref 80.0–100.0)
Monocytes Absolute: 0.5 10*3/uL (ref 0.1–1.0)
Monocytes Relative: 6 %
Neutro Abs: 5.2 10*3/uL (ref 1.7–7.7)
Neutrophils Relative %: 62 %
Platelets: 280 10*3/uL (ref 150–400)
RBC: 5.11 MIL/uL (ref 3.87–5.11)
RDW: 18.8 % — ABNORMAL HIGH (ref 11.5–15.5)
WBC: 8.4 10*3/uL (ref 4.0–10.5)
nRBC: 0 % (ref 0.0–0.2)

## 2021-01-23 LAB — TYPE AND SCREEN
ABO/RH(D): O POS
Antibody Screen: NEGATIVE

## 2021-01-23 LAB — BASIC METABOLIC PANEL
Anion gap: 9 (ref 5–15)
BUN: 13 mg/dL (ref 6–20)
CO2: 24 mmol/L (ref 22–32)
Calcium: 8.6 mg/dL — ABNORMAL LOW (ref 8.9–10.3)
Chloride: 107 mmol/L (ref 98–111)
Creatinine, Ser: 0.87 mg/dL (ref 0.44–1.00)
GFR, Estimated: >60 mL/min (ref >60)
Glucose, Bld: 93 mg/dL (ref 70–99)
Potassium: 4 mmol/L (ref 3.5–5.1)
Sodium: 140 mmol/L (ref 135–145)

## 2021-01-23 NOTE — Progress Notes (Signed)
PCP - patient denies Cardiologist - patient denies  PPM/ICD - n/a Device Orders -  Rep Notified -   Chest x-ray - n/a EKG - n/a Stress Test - patient denies ECHO - patient denies Cardiac Cath - patient denies  Sleep Study - patient denies CPAP -   Fasting Blood Sugar - n/a Checks Blood Sugar _____ times a day  Blood Thinner Instructions: Aspirin Instructions:  ERAS Protcol - clears until 0830 PRE-SURGERY Ensure or G2- no drink ordered  COVID TEST- 01/24/2021   Anesthesia review: yes, elevated BP   Patient denies shortness of breath, fever, cough and chest pain at PAT appointment   All instructions explained to the patient, with a verbal understanding of the material. Patient agrees to go over the instructions while at home for a better understanding. Patient also instructed to self quarantine after being tested for COVID-19. The opportunity to ask questions was provided.

## 2021-01-23 NOTE — Progress Notes (Signed)
.Surgical Instructions    Your procedure is scheduled on 01/28/21.  Report to [facility] Main Entrance "A" at 09:30 A.M., then check in with the Admitting office.  Call this number if you have problems or questions between now and the morning of surgery:  [phone]     Remember:  Do not eat after midnight the night before your surgery  You may drink clear liquids until 08:30am the morning of your surgery.   Clear liquids allowed are: Water, Non-Citrus Juices (without pulp), Carbonated Beverages, Clear Tea, Black Coffee Only, and Gatorade     Take these medicines the morning of surgery with A SIP OF WATER: NONE   As of today, STOP taking any Aspirin (unless otherwise instructed by your surgeon) Aleve, Naproxen, Ibuprofen, Motrin, Advil, Goody's, BC's, all herbal medications, fish oil, and all vitamins.                     Do not wear jewelry, make up, or nail polish            Do not wear lotions, powders, perfumes/colognes, or deodorant.            Do not shave 48 hours prior to surgery.             Do not bring valuables to the hospital.            [facility] is not responsible for any belongings or valuables.  Do NOT Smoke (Tobacco/Vaping) or drink Alcohol 24 hours prior to your procedure  If you use a CPAP at night, you may bring all equipment for your overnight stay.   Contacts, glasses, hearing aids, dentures or partials may not be worn into surgery, please bring cases for these belongings   For patients admitted to the hospital, discharge time will be determined by your treatment team.   Patients discharged the day of surgery will not be allowed to drive home, and someone needs to stay with them for 24 hours.    Special instructions:   [facility]- Preparing For Surgery  Before surgery, you can play an important role. Because skin is not sterile, your skin needs to be as free of germs as possible. You can reduce the number of germs on your skin by washing with CHG  (chlorahexidine gluconate) Soap before surgery.  CHG is an antiseptic cleaner which kills germs and bonds with the skin to continue killing germs even after washing.    Oral Hygiene is also important to reduce your risk of infection.  Remember - BRUSH YOUR TEETH THE MORNING OF SURGERY WITH YOUR REGULAR TOOTHPASTE  Please do not use if you have an allergy to CHG or antibacterial soaps. If your skin becomes reddened/irritated stop using the CHG.  Do not shave (including legs and underarms) for at least 48 hours prior to first CHG shower. It is OK to shave your face.  Please follow these instructions carefully.   1. Shower the NIGHT BEFORE SURGERY and the MORNING OF SURGERY  2. If you chose to wash your hair, wash your hair first as usual with your normal shampoo.  3. After you shampoo, rinse your hair and body thoroughly to remove the shampoo.  4. Wash Face and genitals (private parts) with your normal soap.   5.  Shower the NIGHT BEFORE SURGERY and the MORNING OF SURGERY with CHG Soap.   6. Use CHG Soap as you would any other liquid soap. You can apply CHG directly  to the skin and wash gently with a scrungie or a clean washcloth.   7. Apply the CHG Soap to your body ONLY FROM THE NECK DOWN.  Do not use on open wounds or open sores. Avoid contact with your eyes, ears, mouth and genitals (private parts). Wash Face and genitals (private parts)  with your normal soap.   8. Wash thoroughly, paying special attention to the area where your surgery will be performed.  9. Thoroughly rinse your body with warm water from the neck down.  10. DO NOT shower/wash with your normal soap after using and rinsing off the CHG Soap.  11. Pat yourself dry with a CLEAN TOWEL.  12. Wear CLEAN PAJAMAS to bed the night before surgery  13. Place CLEAN SHEETS on your bed the night before your surgery  14. DO NOT SLEEP WITH PETS.   Day of Surgery: Take a shower. Wear Clean/Comfortable clothing the morning  of surgery Do not apply any deodorants/lotions.   Remember to brush your teeth WITH YOUR REGULAR TOOTHPASTE.   Please read over the following fact sheets that you were given.

## 2021-01-24 ENCOUNTER — Other Ambulatory Visit (HOSPITAL_COMMUNITY)
Admission: RE | Admit: 2021-01-24 | Discharge: 2021-01-24 | Disposition: A | Discharge status: Home / Self Care | Payer: 59 | Source: Ambulatory Visit | Attending: Obstetrics and Gynecology | Admitting: Obstetrics and Gynecology

## 2021-01-24 DIAGNOSIS — Z01812 Encounter for preprocedural laboratory examination: Secondary | ICD-10-CM | POA: Diagnosis not present

## 2021-01-24 DIAGNOSIS — Z20822 Contact with and (suspected) exposure to covid-19: Secondary | ICD-10-CM | POA: Insufficient documentation

## 2021-01-24 LAB — HEMOGLOBIN A1C
Hgb A1c MFr Bld: 5.7 % — ABNORMAL HIGH (ref 4.8–5.6)
Mean Plasma Glucose: 117 mg/dL

## 2021-01-24 LAB — SARS CORONAVIRUS 2 (TAT 6-24 HRS): SARS Coronavirus 2: NEGATIVE

## 2021-01-24 NOTE — Progress Notes (Signed)
Anesthesia Chart Review:  Pt noted to have elevated BP at PAT appointment, 162/85. Pt reported being under significant stress related to upcoming surgery. Denied any associated symptoms, no CP, no HA, no SOB. Review of previous records shows normotensive readings. She denies any hx of hypertension. Preop labs unremarkable. Dr. [name] made aware of elevated BP. Pt advised to monitor and follow up with MD if remains elevated.   BP Readings from Last 3 Encounters:  01/23/21 (!) 162/85  01/04/21 120/82  12/27/20 128/80     Terri Walker [facility] Short Stay Center/Anesthesiology Phone [phone] 01/24/2021 12:08 PM

## 2021-01-25 ENCOUNTER — Other Ambulatory Visit (HOSPITAL_COMMUNITY): Payer: 59

## 2021-01-28 ENCOUNTER — Ambulatory Visit (HOSPITAL_COMMUNITY)
Admission: RE | Admit: 2021-01-28 | Discharge: 2021-01-28 | Disposition: A | Discharge status: Home / Self Care | Payer: 59 | Attending: Obstetrics and Gynecology | Admitting: Obstetrics and Gynecology

## 2021-01-28 ENCOUNTER — Encounter (HOSPITAL_COMMUNITY): Payer: Self-pay | Admitting: Obstetrics and Gynecology

## 2021-01-28 ENCOUNTER — Ambulatory Visit (HOSPITAL_COMMUNITY): Payer: 59 | Admitting: Physician Assistant

## 2021-01-28 ENCOUNTER — Ambulatory Visit (HOSPITAL_COMMUNITY): Payer: 59 | Admitting: Certified Registered"

## 2021-01-28 ENCOUNTER — Other Ambulatory Visit: Payer: Self-pay

## 2021-01-28 ENCOUNTER — Encounter (HOSPITAL_COMMUNITY)
Admission: RE | Disposition: A | Discharge status: Home / Self Care | Payer: Self-pay | Source: Home / Self Care | Attending: Obstetrics and Gynecology

## 2021-01-28 DIAGNOSIS — N802 Endometriosis of fallopian tube: Secondary | ICD-10-CM | POA: Insufficient documentation

## 2021-01-28 DIAGNOSIS — Z975 Presence of (intrauterine) contraceptive device: Secondary | ICD-10-CM | POA: Insufficient documentation

## 2021-01-28 DIAGNOSIS — N921 Excessive and frequent menstruation with irregular cycle: Secondary | ICD-10-CM | POA: Insufficient documentation

## 2021-01-28 DIAGNOSIS — N800 Endometriosis of uterus: Secondary | ICD-10-CM

## 2021-01-28 DIAGNOSIS — N816 Rectocele: Secondary | ICD-10-CM | POA: Diagnosis not present

## 2021-01-28 DIAGNOSIS — D509 Iron deficiency anemia, unspecified: Secondary | ICD-10-CM | POA: Insufficient documentation

## 2021-01-28 DIAGNOSIS — Z90710 Acquired absence of both cervix and uterus: Secondary | ICD-10-CM | POA: Diagnosis present

## 2021-01-28 DIAGNOSIS — R102 Pelvic and perineal pain: Secondary | ICD-10-CM | POA: Diagnosis not present

## 2021-01-28 DIAGNOSIS — N946 Dysmenorrhea, unspecified: Secondary | ICD-10-CM | POA: Insufficient documentation

## 2021-01-28 DIAGNOSIS — Z87891 Personal history of nicotine dependence: Secondary | ICD-10-CM | POA: Diagnosis not present

## 2021-01-28 DIAGNOSIS — K660 Peritoneal adhesions (postprocedural) (postinfection): Secondary | ICD-10-CM | POA: Insufficient documentation

## 2021-01-28 DIAGNOSIS — D251 Intramural leiomyoma of uterus: Secondary | ICD-10-CM | POA: Insufficient documentation

## 2021-01-28 DIAGNOSIS — N8110 Cystocele, unspecified: Secondary | ICD-10-CM | POA: Diagnosis not present

## 2021-01-28 HISTORY — PX: LAPAROSCOPIC LYSIS OF ADHESIONS: SHX5905

## 2021-01-28 HISTORY — PX: CYSTOSCOPY: SHX5120

## 2021-01-28 HISTORY — PX: TOTAL LAPAROSCOPIC HYSTERECTOMY WITH SALPINGECTOMY: SHX6742

## 2021-01-28 LAB — POCT PREGNANCY, URINE: Preg Test, Ur: NEGATIVE

## 2021-01-28 LAB — ABO/RH: ABO/RH(D): O POS

## 2021-01-28 SURGERY — HYSTERECTOMY, TOTAL, LAPAROSCOPIC, WITH SALPINGECTOMY
Anesthesia: General | Site: Uterus

## 2021-01-28 MED ORDER — MIDAZOLAM HCL 2 MG/2ML IJ SOLN
INTRAMUSCULAR | Status: AC
  Filled 2021-01-28: qty 2

## 2021-01-28 MED ORDER — MIDAZOLAM HCL 2 MG/2ML IJ SOLN
INTRAMUSCULAR | Status: DC | PRN
  Administered 2021-01-28: 2 mg via INTRAVENOUS

## 2021-01-28 MED ORDER — ROPIVACAINE HCL 5 MG/ML IJ SOLN
INTRAMUSCULAR | Status: AC
  Filled 2021-01-28: qty 30

## 2021-01-28 MED ORDER — GABAPENTIN 300 MG PO CAPS
ORAL_CAPSULE | ORAL | Status: AC
  Administered 2021-01-28: 300 mg via ORAL
  Filled 2021-01-28: qty 1

## 2021-01-28 MED ORDER — DOCUSATE SODIUM 100 MG PO CAPS: 100.0000 mg | ORAL_CAPSULE | Freq: Two times a day (BID) | ORAL | 0 refills | Status: DC

## 2021-01-28 MED ORDER — ALUM & MAG HYDROXIDE-SIMETH 200-200-20 MG/5ML PO SUSP: 30.0000 mL | ORAL | Status: DC | PRN

## 2021-01-28 MED ORDER — CHLORHEXIDINE GLUCONATE 0.12 % MT SOLN: 15.0000 mL | Freq: Once | OROMUCOSAL | Status: AC

## 2021-01-28 MED ORDER — DEXAMETHASONE SODIUM PHOSPHATE 10 MG/ML IJ SOLN
INTRAMUSCULAR | Status: AC
  Filled 2021-01-28: qty 1

## 2021-01-28 MED ORDER — DOCUSATE SODIUM 100 MG PO CAPS: 100.0000 mg | ORAL_CAPSULE | Freq: Two times a day (BID) | ORAL | Status: DC

## 2021-01-28 MED ORDER — 0.9 % SODIUM CHLORIDE (POUR BTL) OPTIME
TOPICAL | Status: DC | PRN
  Administered 2021-01-28: 1000 mL

## 2021-01-28 MED ORDER — KETOROLAC TROMETHAMINE 30 MG/ML IJ SOLN
INTRAMUSCULAR | Status: AC
  Filled 2021-01-28: qty 1

## 2021-01-28 MED ORDER — ENOXAPARIN SODIUM 40 MG/0.4ML ~~LOC~~ SOLN: 40.0000 mg | SUBCUTANEOUS | Status: AC

## 2021-01-28 MED ORDER — CHLORHEXIDINE GLUCONATE 0.12 % MT SOLN
OROMUCOSAL | Status: AC
  Administered 2021-01-28: 15 mL via OROMUCOSAL
  Filled 2021-01-28: qty 15

## 2021-01-28 MED ORDER — POVIDONE-IODINE 10 % EX SWAB: 2.0000 "application " | Freq: Once | CUTANEOUS | Status: DC

## 2021-01-28 MED ORDER — ROCURONIUM BROMIDE 10 MG/ML (PF) SYRINGE
PREFILLED_SYRINGE | INTRAVENOUS | Status: AC
  Filled 2021-01-28: qty 10

## 2021-01-28 MED ORDER — SODIUM CHLORIDE 0.9 % IR SOLN
Status: DC | PRN
  Administered 2021-01-28 (×2): 1000 mL

## 2021-01-28 MED ORDER — FENTANYL CITRATE (PF) 250 MCG/5ML IJ SOLN
INTRAMUSCULAR | Status: AC
  Filled 2021-01-28: qty 5

## 2021-01-28 MED ORDER — PROPOFOL 10 MG/ML IV BOLUS
INTRAVENOUS | Status: AC
  Filled 2021-01-28: qty 40

## 2021-01-28 MED ORDER — BUPIVACAINE HCL (PF) 0.25 % IJ SOLN
INTRAMUSCULAR | Status: DC | PRN
  Administered 2021-01-28: 11 mL

## 2021-01-28 MED ORDER — FENTANYL CITRATE (PF) 100 MCG/2ML IJ SOLN
INTRAMUSCULAR | Status: DC | PRN
  Administered 2021-01-28: 50 ug via INTRAVENOUS
  Administered 2021-01-28: 100 ug via INTRAVENOUS
  Administered 2021-01-28 (×4): 50 ug via INTRAVENOUS

## 2021-01-28 MED ORDER — ONDANSETRON HCL 4 MG/2ML IJ SOLN: 4.0000 mg | Freq: Four times a day (QID) | INTRAMUSCULAR | Status: DC | PRN

## 2021-01-28 MED ORDER — ACETAMINOPHEN 500 MG PO TABS
ORAL_TABLET | ORAL | Status: AC
  Administered 2021-01-28: 1000 mg via ORAL
  Filled 2021-01-28: qty 2

## 2021-01-28 MED ORDER — SODIUM CHLORIDE 0.9 % IV SOLN
INTRAVENOUS | Status: DC | PRN
  Administered 2021-01-28: 60 mL

## 2021-01-28 MED ORDER — KETOROLAC TROMETHAMINE 30 MG/ML IJ SOLN
30.0000 mg | Freq: Four times a day (QID) | INTRAMUSCULAR | Status: DC
  Administered 2021-01-28: 30 mg via INTRAVENOUS
  Filled 2021-01-28 (×2): qty 1

## 2021-01-28 MED ORDER — ZOLPIDEM TARTRATE 5 MG PO TABS
5.0000 mg | ORAL_TABLET | Freq: Every evening | ORAL | Status: DC | PRN
Start: 2021-01-28 — End: 2021-01-29

## 2021-01-28 MED ORDER — ACETAMINOPHEN 500 MG PO TABS
1000.0000 mg | ORAL_TABLET | Freq: Four times a day (QID) | ORAL | Status: DC
  Administered 2021-01-28 (×2): 1000 mg via ORAL
  Filled 2021-01-28: qty 2

## 2021-01-28 MED ORDER — PROPOFOL 10 MG/ML IV BOLUS
INTRAVENOUS | Status: DC | PRN
  Administered 2021-01-28: 170 mg via INTRAVENOUS

## 2021-01-28 MED ORDER — GABAPENTIN 300 MG PO CAPS: 300.0000 mg | ORAL_CAPSULE | ORAL | Status: AC

## 2021-01-28 MED ORDER — ONDANSETRON HCL 4 MG PO TABS
4.0000 mg | ORAL_TABLET | Freq: Four times a day (QID) | ORAL | Status: DC | PRN
  Administered 2021-01-28: 4 mg via ORAL
  Filled 2021-01-28: qty 1

## 2021-01-28 MED ORDER — LACTATED RINGERS IV SOLN: INTRAVENOUS | Status: DC

## 2021-01-28 MED ORDER — ACETAMINOPHEN 500 MG PO TABS: 1000.0000 mg | ORAL_TABLET | Freq: Four times a day (QID) | ORAL | 0 refills | Status: DC

## 2021-01-28 MED ORDER — DEXAMETHASONE SODIUM PHOSPHATE 10 MG/ML IJ SOLN
INTRAMUSCULAR | Status: DC | PRN
  Administered 2021-01-28: 10 mg via INTRAVENOUS

## 2021-01-28 MED ORDER — ONDANSETRON HCL 4 MG/2ML IJ SOLN
INTRAMUSCULAR | Status: DC | PRN
  Administered 2021-01-28 (×2): 4 mg via INTRAVENOUS

## 2021-01-28 MED ORDER — ONDANSETRON HCL 4 MG/2ML IJ SOLN
INTRAMUSCULAR | Status: AC
  Filled 2021-01-28: qty 2

## 2021-01-28 MED ORDER — SODIUM CHLORIDE 0.9 % IV SOLN
INTRAVENOUS | Status: AC
  Filled 2021-01-28: qty 2

## 2021-01-28 MED ORDER — SODIUM CHLORIDE 0.9 % IV SOLN
2.0000 g | INTRAVENOUS | Status: AC
  Administered 2021-01-28: 2 g via INTRAVENOUS

## 2021-01-28 MED ORDER — HYDROMORPHONE HCL 1 MG/ML IJ SOLN
0.2000 mg | INTRAMUSCULAR | Status: DC | PRN
  Administered 2021-01-28: 0.6 mg via INTRAVENOUS
  Filled 2021-01-28: qty 1

## 2021-01-28 MED ORDER — BUPIVACAINE HCL (PF) 0.25 % IJ SOLN
INTRAMUSCULAR | Status: AC
  Filled 2021-01-28: qty 30

## 2021-01-28 MED ORDER — SUGAMMADEX SODIUM 200 MG/2ML IV SOLN
INTRAVENOUS | Status: DC | PRN
  Administered 2021-01-28: 300 mg via INTRAVENOUS

## 2021-01-28 MED ORDER — OXYCODONE HCL 5 MG PO TABS
5.0000 mg | ORAL_TABLET | ORAL | Status: DC | PRN
  Administered 2021-01-28: 10 mg via ORAL
  Filled 2021-01-28: qty 2

## 2021-01-28 MED ORDER — ACETAMINOPHEN 500 MG PO TABS: 1000.0000 mg | ORAL_TABLET | ORAL | Status: AC

## 2021-01-28 MED ORDER — ORAL CARE MOUTH RINSE: 15.0000 mL | Freq: Once | OROMUCOSAL | Status: AC

## 2021-01-28 MED ORDER — SODIUM CHLORIDE (PF) 0.9 % IJ SOLN
INTRAMUSCULAR | Status: AC
  Filled 2021-01-28: qty 50

## 2021-01-28 MED ORDER — LIDOCAINE 2% (20 MG/ML) 5 ML SYRINGE
INTRAMUSCULAR | Status: DC | PRN
  Administered 2021-01-28: 40 mg via INTRAVENOUS

## 2021-01-28 MED ORDER — IBUPROFEN 800 MG PO TABS: 800.0000 mg | ORAL_TABLET | Freq: Four times a day (QID) | ORAL | Status: DC

## 2021-01-28 MED ORDER — OXYCODONE HCL 5 MG PO TABS: 5.0000 mg | ORAL_TABLET | ORAL | 0 refills | Status: DC | PRN

## 2021-01-28 MED ORDER — GLYCOPYRROLATE 0.2 MG/ML IJ SOLN
INTRAMUSCULAR | Status: DC | PRN
  Administered 2021-01-28: .1 mg via INTRAVENOUS

## 2021-01-28 MED ORDER — LIDOCAINE 2% (20 MG/ML) 5 ML SYRINGE
INTRAMUSCULAR | Status: AC
  Filled 2021-01-28: qty 5

## 2021-01-28 MED ORDER — KETOROLAC TROMETHAMINE 30 MG/ML IJ SOLN
30.0000 mg | Freq: Once | INTRAMUSCULAR | Status: AC
  Administered 2021-01-28: 30 mg via INTRAVENOUS

## 2021-01-28 MED ORDER — ENOXAPARIN SODIUM 40 MG/0.4ML ~~LOC~~ SOLN
SUBCUTANEOUS | Status: AC
  Administered 2021-01-28: 40 mg via SUBCUTANEOUS
  Filled 2021-01-28: qty 0.4

## 2021-01-28 MED ORDER — IBUPROFEN 800 MG PO TABS: 800.0000 mg | ORAL_TABLET | Freq: Three times a day (TID) | ORAL | 1 refills | Status: DC | PRN

## 2021-01-28 MED ORDER — ROCURONIUM BROMIDE 10 MG/ML (PF) SYRINGE
PREFILLED_SYRINGE | INTRAVENOUS | Status: DC | PRN
  Administered 2021-01-28: 20 mg via INTRAVENOUS
  Administered 2021-01-28: 30 mg via INTRAVENOUS
  Administered 2021-01-28: 70 mg via INTRAVENOUS

## 2021-01-28 MED ORDER — MENTHOL 3 MG MT LOZG: 1.0000 | LOZENGE | OROMUCOSAL | Status: DC | PRN

## 2021-01-28 SURGICAL SUPPLY — 63 items
APPLICATOR ARISTA FLEXITIP XL (MISCELLANEOUS) IMPLANT
CABLE HIGH FREQUENCY MONO STRZ (ELECTRODE) IMPLANT
CANISTER SUCT 3000ML PPV (MISCELLANEOUS) ×4 IMPLANT
CELL SAVER LIPIGURD (MISCELLANEOUS) IMPLANT
COVER MAYO STAND STRL (DRAPES) ×4 IMPLANT
COVER WAND RF STERILE (DRAPES) ×4 IMPLANT
DECANTER SPIKE VIAL GLASS SM (MISCELLANEOUS) ×8 IMPLANT
DERMABOND ADVANCED (GAUZE/BANDAGES/DRESSINGS) ×1
DERMABOND ADVANCED .7 DNX12 (GAUZE/BANDAGES/DRESSINGS) ×3 IMPLANT
DURAPREP 26ML APPLICATOR (WOUND CARE) ×4 IMPLANT
EXTRT SYSTEM ALEXIS 14CM (MISCELLANEOUS)
EXTRT SYSTEM ALEXIS 17CM (MISCELLANEOUS)
GLOVE ECLIPSE 6.5 STRL STRAW (GLOVE) ×4 IMPLANT
GLOVE SURG UNDER POLY LF SZ7 (GLOVE) ×12 IMPLANT
GOWN STRL REUS W/ TWL LRG LVL3 (GOWN DISPOSABLE) ×6 IMPLANT
GOWN STRL REUS W/TWL LRG LVL3 (GOWN DISPOSABLE) ×2
HARMONIC RUM II 2.5CM SILVER (DISPOSABLE)
HARMONIC RUM II 3.0CM SILVER (DISPOSABLE)
HARMONIC RUM II 3.5CM SILVER (DISPOSABLE) ×4
HARMONIC RUM II 4.0CM SILVER (DISPOSABLE)
HEMOSTAT ARISTA ABSORB 3G PWDR (HEMOSTASIS) IMPLANT
HIBICLENS CHG 4% 4OZ BTL (MISCELLANEOUS) ×4 IMPLANT
KIT TURNOVER KIT B (KITS) ×4 IMPLANT
LIGASURE VESSEL 5MM BLUNT TIP (ELECTROSURGICAL) ×4 IMPLANT
NEEDLE INSUFFLATION 14GA 120MM (NEEDLE) ×8 IMPLANT
PACK LAPAROSCOPY BASIN (CUSTOM PROCEDURE TRAY) IMPLANT
PACK TRENDGUARD 450 HYBRID PRO (MISCELLANEOUS) ×3 IMPLANT
PACK TRENDGUARD 600 HYBRD PROC (MISCELLANEOUS) IMPLANT
POUCH LAPAROSCOPIC INSTRUMENT (MISCELLANEOUS) ×4 IMPLANT
PROTECTOR NERVE ULNAR (MISCELLANEOUS) ×8 IMPLANT
SCALPEL HRMNC RUM II 2.5 SILVR (DISPOSABLE) IMPLANT
SCALPEL HRMNC RUM II 3.0 SILVR (DISPOSABLE) IMPLANT
SCALPEL HRMNC RUM II 3.5 SILVR (DISPOSABLE) ×3 IMPLANT
SCALPEL HRMNC RUM II 4.0 SILVR (DISPOSABLE) IMPLANT
SCISSORS LAP 5X35 DISP (ENDOMECHANICALS) IMPLANT
SET CYSTO W/LG BORE CLAMP LF (SET/KITS/TRAYS/PACK) ×4 IMPLANT
SET IRRIG TUBING LAPAROSCOPIC (IRRIGATION / IRRIGATOR) ×4 IMPLANT
SET TRI-LUMEN FLTR TB AIRSEAL (TUBING) ×4 IMPLANT
SHEARS HARMONIC ACE PLUS 36CM (ENDOMECHANICALS) ×4 IMPLANT
SUT VIC AB 0 CT1 27 (SUTURE) ×1
SUT VIC AB 0 CT1 27XBRD ANBCTR (SUTURE) ×3 IMPLANT
SUT VICRYL 0 UR6 27IN ABS (SUTURE) ×4 IMPLANT
SUT VICRYL 4-0 PS2 18IN ABS (SUTURE) ×4 IMPLANT
SUT VLOC 180 0 9IN  GS21 (SUTURE)
SUT VLOC 180 0 9IN GS21 (SUTURE) IMPLANT
SYR 50ML LL SCALE MARK (SYRINGE) ×8 IMPLANT
SYSTEM CONTND EXTRCTN KII BLLN (MISCELLANEOUS) IMPLANT
TIP RUMI ORANGE 6.7MMX12CM (TIP) IMPLANT
TIP UTERINE 5.1X6CM LAV DISP (MISCELLANEOUS) IMPLANT
TIP UTERINE 6.7X10CM GRN DISP (MISCELLANEOUS) IMPLANT
TIP UTERINE 6.7X6CM WHT DISP (MISCELLANEOUS) IMPLANT
TIP UTERINE 6.7X8CM BLUE DISP (MISCELLANEOUS) ×4 IMPLANT
TOWEL GREEN STERILE FF (TOWEL DISPOSABLE) ×8 IMPLANT
TRAY FOLEY W/BAG SLVR 14FR (SET/KITS/TRAYS/PACK) ×4 IMPLANT
TRENDGUARD 450 HYBRID PRO PACK (MISCELLANEOUS) ×4
TRENDGUARD 600 HYBRID PROC PK (MISCELLANEOUS)
TROCAR 5M 150ML BLDLS (TROCAR) ×4 IMPLANT
TROCAR ADV FIXATION 5X100MM (TROCAR) ×8 IMPLANT
TROCAR PORT AIRSEAL 5X120 (TROCAR) ×4 IMPLANT
TROCAR XCEL NON BLADE 8MM B8LT (ENDOMECHANICALS) ×4 IMPLANT
TROCAR XCEL NON-BLD 5MMX100MML (ENDOMECHANICALS) ×4 IMPLANT
UNDERPAD 30X36 HEAVY ABSORB (UNDERPADS AND DIAPERS) ×4 IMPLANT
WARMER LAPAROSCOPE (MISCELLANEOUS) ×4 IMPLANT

## 2021-01-28 NOTE — Interval H&P Note (Signed)
History and Physical Interval Note:  01/28/2021 7:20 AM  Terri Walker  has presented today for surgery, with the diagnosis of menometrorrhagia, pelvic pain, hx of anemia.  The various methods of treatment have been discussed with the patient and family. After consideration of risks, benefits and other options for treatment, the patient has consented to  Procedure(s): TOTAL LAPAROSCOPIC HYSTERECTOMY WITH SALPINGECTOMY (Bilateral) CYSTOSCOPY (N/A) as a surgical intervention.  The patient's history has been reviewed, patient examined, no change in status, stable for surgery.  I have reviewed the patient's chart and labs.  Questions were answered to the patient's satisfaction.     Salvadore Dom

## 2021-01-28 NOTE — Progress Notes (Signed)
IV removal well tolerated, discharge instructions reviewed, questions asked and answered, belongings given to patient, patient to transport in family car.

## 2021-01-28 NOTE — Op Note (Signed)
Preoperative Diagnosis: Menometrorrhagia, anemia, severe dysmenorrhea, pelvic pain, fibroid uterus  Postoperative Diagnosis: same  Procedure:  Total Laparoscopic Hysterectomy with bilateral salpingectomies, cystoscopy and lysis of adhesions.  Surgeon: Dr Sumner Boast  Assistant: Dr Harrie Foreman, an MD assistant was necessary for tissue manipulation, retraction and positioning due to the complexity of the case and hospital policies   Anesthesia: General  EBL: 25  Fluids: 1,300 cc LR  Urine output: 100  Uterine weight: 317.2 grams   Indications for surgery: The patient is a 40 year old female, who presented with menometrorrhagia leading to anemia, severe dysmenorrhea and pelvic pain. She was treated with OCP's without help. An ultrasound revealed 3 intramural myomas, endometrial biopsy was benign. No help with mirena IUD. The patient desires definitive therapy.  The patient is aware of the risks and complications involved with the surgery and consent was obtained prior to the procedure.  Findings: EUA: 8 week sized anteverted uterus, no adnexal masses. Laparoscopy: normal uterus and bilateral adnexa with evidence of prior tubal ligation. There were omental adhesions to the abdominal wall. Normal liver edge.    Procedure: The patient was taken to the operating room with an IV in placed, preoperative antibiotics had been administered. She was placed in the dorsal lithotomy position. General anesthesia was administered. She was prepped and draped in the usual sterile fashion for an abdominal, vaginal surgery. A rumi uterine manipulator was placed, using a # 3.5 cup and a 8 cm extender. A foley catheter was placed.    The umbilicus was everted, injected with 0.25% marcaine and incised with a # 11 blade. 2 towel clips were used to elevated the umbilicus and 2 attempts were made to place the veress needle into the abdominal cavity, both times the gas indicated I was preperitoneal. The decision  was made to do direct entry. The 5 mm laparoscope was placed into the abdominal cavity using the opti-view trocar. After getting into the abdominal cavity the abdomen was insufflated with CO2. The patient was placed in trendelenburg and the abdominal pelvic cavity was inspected. 3 more trocars were placed: 1 in each lower quadrant approximately 3 cm medial to and superior to the anterior superior iliac spine and one in the midline approximately 6 cm above the pubic symphysis in the midline. These areas were injected with 0.25% marcaine, incised with a #11 blade and all trocars were inserted with direct visualization with the laparoscope. A # 5 airseal trocar was placed in the RLQ, a 5 mm trocar in the LLQ and a #8 trocar in the midline. The abdominal pelvic cavity was again inspected. The adhesions of the omentum to the anterior abdominal wall were taken down with the harmonic scalpel. A mixture of 30 cc of Robivacaine and 30 cc of NS was place in the pelvic cavity. The ureters were identified bilaterally.  The left tube was elevated from the pelvic sidewall, cauterized and cut with the ligasure device. The mesosalpinx was cauterized and cut with the ligasure device.  The tube was separated from the uterus using the ligasure device and removed through the midline trocar. The left uterine ovarian ligament was cauterized and cut with the ligasure device. The left round ligament was cauterized and cut with the ligasure device and the anterior and posterior leafs of the broad ligament were taken down with the ligasure device. The harmonic scalpel was then used to take down the bladder flap and skeltonize the vessels. The left uterine vessels were then clamped, cauterized and ligated  with the ligasure device. Hemostasis was excellent. The same procedure was repeated on the right.   Using the rumi manipulator the uterus was pushed up in the pelvic cavity and the harmonic scalpel was used to separate the cervix from  the vagina using the harmonic energy. The uterus was removed vaginally at this time. An occluder was placed in the vagina to maintain pneumoperitoneum. The vaginal cuff was then closed with a 0 V-lock suture. Hemostasis was excellent. The abdominal pelvic cavity was irrigated and suctioned dry. Pressure was released and hemostasis remained excellent.   The abdominal cavity was desufflated and the trocars were removed. The skin was closed with subcuticular stiches of 4-0 vicryl and dermabond was placed over the incisions.  The foley catheter was removed and cystoscopy was performed using a 70 degree scope. Both ureters expelled urine, no bladder abnormalities were noted. The bladder was allowed to drain and the cystoscope was removed.   The patient's abdomen and perineum were cleansed and she was taken out of the dorsal lithotomy position. Upon awakening she was extubated and taken to the recovery room in stable condition. The sponge and instrument counts were correct.   Dr Delilah Shan specifically helped with exposure and traction. He removed the right fallopian tube, took down the right round ligament, the right broad ligament and uterine vessels on the right.

## 2021-01-28 NOTE — Discharge Summary (Signed)
Physician Discharge Summary  Patient ID: Terri Walker MRN: [id number] DOB/AGE: 1981/03/22 40 y.o.  Admit date: 01/28/2021 Discharge date: 01/28/2021  Admission Diagnoses: menometrorrhagia, history of anemia, fibroid uterus, severe dysmenorrhea, pelvic pain  Discharge Diagnoses:  Active Problems:   S/P laparoscopic hysterectomy   Discharged Condition: good  Hospital Course: uncomplicated  Consults: None  Significant Diagnostic Studies: none   Treatments: surgery: total laparoscopic hysterectomy, bilateral salpingectomies, lysis of adhesions and cystoscopy  Today's Vitals   01/28/21 1323 01/28/21 1346 01/28/21 1557 01/28/21 1735  BP: (!) 155/91 (!) 173/92 134/71 (!) 169/87  Pulse: 91 92  99  Resp: 19 19 19    Temp: 97.9 F (36.6 C) 98.5 F (36.9 C) 98.8 F (37.1 C) 99.2 F (37.3 C)  TempSrc:  Oral Oral Oral  SpO2: 95% 98% 99% 97%  Weight:      Height:      PainSc:       No intake/output data recorded. Total I/O In: 1500 [I.V.:1400; IV Piggyback:100] Out: 125 [Urine:100; Blood:25] Patient reports voiding several times this afternoon (not recorded)    Discharge Exam: Blood pressure (!) 169/87, pulse 99, temperature 99.2 F (37.3 C), temperature source Oral, resp. rate 19, height 5\' 3"  (1.6 m), weight 132 kg, SpO2 97 %. General appearance: alert, cooperative and no distress Resp: clear to auscultation bilaterally Cardio: S1, S2 normal GI: soft, non-tender; bowel sounds normal; no masses,  no organomegaly Extremities: extremities normal, atraumatic, no cyanosis or edema  Incisions: clean, dry and intact without erythema.  Disposition: Discharge disposition: 01-Home or Self Care       Discharge Instructions    Call MD for:   Complete by: As directed    Heavy vaginal bleeding   Call MD for:  difficulty breathing, headache or visual disturbances   Complete by: As directed    Call MD for:  hives   Complete by: As directed    Call MD for:   persistant dizziness or light-headedness   Complete by: As directed    Call MD for:  persistant nausea and vomiting   Complete by: As directed    Call MD for:  redness, tenderness, or signs of infection (pain, swelling, redness, odor or green/yellow discharge around incision site)   Complete by: As directed    Call MD for:  severe uncontrolled pain   Complete by: As directed    Call MD for:  temperature >100.4   Complete by: As directed    Diet - low sodium heart healthy   Complete by: As directed    Driving Restrictions   Complete by: As directed    No driving while taking narcotics   Increase activity slowly   Complete by: As directed    May shower / Bathe   Complete by: As directed    You can shower 24 hours after surgery.   No dressing needed   Complete by: As directed    Sexual Activity Restrictions   Complete by: As directed    No intercourse for 12 weeks     Allergies as of 01/28/2021      Reactions   Bee Venom Anaphylaxis      Medication List    TAKE these medications   acetaminophen 500 MG tablet Commonly known as: TYLENOL Take 2 tablets (1,000 mg total) by mouth every 6 (six) hours. Start taking on: January 29, 2021   docusate sodium 100 MG capsule Commonly known as: COLACE Take 1 capsule (100 mg total) by mouth 2 (  two) times daily.   ibuprofen 800 MG tablet Commonly known as: ADVIL Take 1 tablet (800 mg total) by mouth every 8 (eight) hours as needed.   oxyCODONE 5 MG immediate release tablet Commonly known as: Oxy IR/ROXICODONE Take 1-2 tablets (5-10 mg total) by mouth every 4 (four) hours as needed for moderate pain.            Discharge Care Instructions  (From admission, onward)         Start     Ordered   01/28/21 0000  No dressing needed        01/28/21 1838           Signed: Salvadore Dom 01/28/2021, 6:39 PM

## 2021-01-28 NOTE — Anesthesia Postprocedure Evaluation (Signed)
Anesthesia Post Note  Patient: Terri Walker  Procedure(s) Performed: TOTAL LAPAROSCOPIC HYSTERECTOMY WITH SALPINGECTOMY (Bilateral Uterus) CYSTOSCOPY (N/A Bladder) LAPAROSCOPIC LYSIS OF ADHESIONS (N/A Abdomen)     Patient location during evaluation: PACU Anesthesia Type: General Level of consciousness: awake and alert Pain management: pain level controlled Vital Signs Assessment: post-procedure vital signs reviewed and stable Respiratory status: spontaneous breathing, nonlabored ventilation, respiratory function stable and patient connected to nasal cannula oxygen Cardiovascular status: blood pressure returned to baseline and stable Postop Assessment: no apparent nausea or vomiting Anesthetic complications: no   No complications documented.  Last Vitals:  Vitals:   01/28/21 1346 01/28/21 1557  BP: (!) 173/92 134/71  Pulse: 92   Resp: 19 19  Temp: 36.9 C 37.1 C  SpO2: 98% 99%    Last Pain:  Vitals:   01/28/21 1557  TempSrc: Oral  PainSc:                  Artis Buechele COKER

## 2021-01-28 NOTE — Progress Notes (Signed)
Notified Dr. Linna Caprice of elevated BP.  Last BP taken 189/108 at 0649.  No new orders given.

## 2021-01-28 NOTE — Anesthesia Preprocedure Evaluation (Signed)
Anesthesia Evaluation  Patient identified by MRN, date of birth, ID band Patient awake    Reviewed: Allergy & Precautions, NPO status , Patient's Chart, lab work & pertinent test results  Airway Mallampati: III  TM Distance: >3 FB Neck ROM: Full    Dental  (+) Teeth Intact, Dental Advisory Given   Pulmonary former smoker,    breath sounds clear to auscultation       Cardiovascular  Rhythm:Regular Rate:Normal     Neuro/Psych    GI/Hepatic   Endo/Other    Renal/GU      Musculoskeletal   Abdominal   Peds  Hematology   Anesthesia Other Findings   Reproductive/Obstetrics                             Anesthesia Physical Anesthesia Plan  ASA: III  Anesthesia Plan: General   Post-op Pain Management:    Induction: Intravenous  PONV Risk Score and Plan: Ondansetron and Dexamethasone  Airway Management Planned: Oral ETT  Additional Equipment:   Intra-op Plan:   Post-operative Plan: Extubation in OR  Informed Consent: I have reviewed the patients History and Physical, chart, labs and discussed the procedure including the risks, benefits and alternatives for the proposed anesthesia with the patient or authorized representative who has indicated his/her understanding and acceptance.     Dental advisory given  Plan Discussed with: CRNA and Anesthesiologist  Anesthesia Plan Comments:         Anesthesia Quick Evaluation

## 2021-01-28 NOTE — Transfer of Care (Signed)
Immediate Anesthesia Transfer of Care Note  Patient: Terri Walker  Procedure(s) Performed: TOTAL LAPAROSCOPIC HYSTERECTOMY WITH SALPINGECTOMY (Bilateral Uterus) CYSTOSCOPY (N/A Bladder) LAPAROSCOPIC LYSIS OF ADHESIONS (N/A Abdomen)  Patient Location: PACU  Anesthesia Type:General  Level of Consciousness: awake, alert  and oriented  Airway & Oxygen Therapy: Patient Spontanous Breathing and Patient connected to face mask oxygen  Post-op Assessment: Report given to RN and Post -op Vital signs reviewed and stable  Post vital signs: Reviewed and stable  Last Vitals:  Vitals Value Taken Time  BP 135/69 01/28/21 1039  Temp    Pulse 82 01/28/21 1039  Resp 22 01/28/21 1039  SpO2 97 % 01/28/21 1039  Vitals shown include unvalidated device data.  Last Pain:  Vitals:   01/28/21 0637  TempSrc:   PainSc: 5       Patients Stated Pain Goal: 2 (02/54/27 0623)  Complications: No complications documented.

## 2021-01-28 NOTE — Anesthesia Procedure Notes (Signed)
Procedure Name: Intubation Date/Time: 01/28/2021 7:38 AM Performed by: Genelle Bal, CRNA Pre-anesthesia Checklist: Patient identified, Emergency Drugs available, Suction available and Patient being monitored Patient Re-evaluated:Patient Re-evaluated prior to induction Oxygen Delivery Method: Circle system utilized Preoxygenation: Pre-oxygenation with 100% oxygen Induction Type: IV induction Ventilation: Oral airway inserted - appropriate to patient size and Two handed mask ventilation required Laryngoscope Size: Miller and 2 Grade View: Grade II Tube type: Oral Tube size: 7.0 mm Number of attempts: 1 Airway Equipment and Method: Stylet and Oral airway Placement Confirmation: ETT inserted through vocal cords under direct vision,  positive ETCO2 and breath sounds checked- equal and bilateral Secured at: 21 cm Tube secured with: Tape Dental Injury: Teeth and Oropharynx as per pre-operative assessment

## 2021-01-29 ENCOUNTER — Encounter (HOSPITAL_COMMUNITY): Payer: Self-pay | Admitting: Obstetrics and Gynecology

## 2021-01-29 ENCOUNTER — Telehealth: Payer: Self-pay | Admitting: *Deleted

## 2021-01-29 LAB — SURGICAL PATHOLOGY

## 2021-01-29 MED ORDER — OXYCODONE HCL 5 MG PO TABS: 5.0000 mg | ORAL_TABLET | ORAL | 0 refills | Status: DC | PRN

## 2021-01-29 NOTE — Telephone Encounter (Signed)
Patient had surgery today pharmacy said issue with Rx for oxycodone 5 mg tablet. I called and spoke with the pharmacist and he said the MME for the directions are at 57 and the MME needs to be 50 and below. I am not sure what this means, but the pharmacist said you would know.  He said the directions need to be changed to 1 tablet every 4 hours or 1-2 tablets every 8 hours. Please advise

## 2021-01-29 NOTE — Telephone Encounter (Signed)
I spoke with Dr.Jertson and she corrected the Rx. Dr.Jertson told me patient can take 2 tablet if needed. Patient informed with this as well.

## 2021-01-30 ENCOUNTER — Telehealth: Payer: Self-pay | Admitting: *Deleted

## 2021-01-30 NOTE — Telephone Encounter (Signed)
Patient called post surgery on 01/28/21 TOTAL LAPAROSCOPIC HYSTERECTOMY WITH SALPINGECTOMY called today stating she noticed a small piece of the glue at her belly button came off. She reports about 2 O clock this am she pulled on her shirt while sleep due to being hot and thought maybe this is how the glue was removed. Her husband said the incision is not open, notes no drainage, she did report the incision on the side of her stomach stings randomly off and on, but not open or no drainage.  Otherwise patient is doing well.  Just asked me to run this info by you. Please advise

## 2021-01-31 NOTE — Telephone Encounter (Signed)
As long as she isn't having significant discharge from her umbilicus I think she is okay.

## 2021-01-31 NOTE — Telephone Encounter (Signed)
Patient informed with below note. 

## 2021-02-04 ENCOUNTER — Ambulatory Visit (INDEPENDENT_AMBULATORY_CARE_PROVIDER_SITE_OTHER): Payer: 59 | Admitting: Obstetrics and Gynecology

## 2021-02-04 ENCOUNTER — Encounter: Payer: Self-pay | Admitting: Obstetrics and Gynecology

## 2021-02-04 ENCOUNTER — Other Ambulatory Visit: Payer: Self-pay

## 2021-02-04 VITALS — BP 110/68 | HR 88 | Resp 16 | Ht 63.25 in | Wt 290.0 lb

## 2021-02-04 DIAGNOSIS — Z90710 Acquired absence of both cervix and uterus: Secondary | ICD-10-CM

## 2021-02-04 NOTE — Progress Notes (Signed)
GYNECOLOGY  VISIT   HPI: 40 y.o.   Married Other or two or more races Hispanic or Latino  female   [phone] with No LMP recorded. (Menstrual status: IUD).   here for one week post op following total hysterectomy. Patient states that she has her energy back, doing well. Not taking narcotics. Normal bowel and bladder c/o.  She is having no bleeding.   GYNECOLOGIC HISTORY: No LMP recorded. (Menstrual status: IUD). Contraception: Post hysterectomy Menopausal hormone therapy: none         OB History    Gravida  5   Para  4   Term  4   Preterm  0   AB  1   Living  4     SAB  1   IAB  0   Ectopic  0   Multiple  0   Live Births  4              Patient Active Problem List   Diagnosis Date Noted  . S/P laparoscopic hysterectomy 01/28/2021  . Family history of breast cancer   . IDA (iron deficiency anemia) 07/17/2020  . H/O tubal ligation 06/29/2018  . Anemia 06/29/2018  . Menometrorrhagia 06/29/2018    Past Medical History:  Diagnosis Date  . Abnormal Pap smear of cervix   . Abnormal uterine bleeding   . Anemia   . Anxiety   . Asthma    in the past  . Depression   . Family history of breast cancer   . Migraine without aura   . Ovarian cyst     Past Surgical History:  Procedure Laterality Date  . CESAREAN SECTION     times 2  . CESAREAN SECTION WITH BILATERAL TUBAL LIGATION    . COLPOSCOPY    . CYSTOSCOPY N/A 01/28/2021   Procedure: CYSTOSCOPY;  Surgeon: Salvadore Dom, MD;  Location: [facility];  Service: Gynecology;  Laterality: N/A;  . LAPAROSCOPIC LYSIS OF ADHESIONS N/A 01/28/2021   Procedure: LAPAROSCOPIC LYSIS OF ADHESIONS;  Surgeon: Salvadore Dom, MD;  Location: [facility];  Service: Gynecology;  Laterality: N/A;  . TOTAL LAPAROSCOPIC HYSTERECTOMY WITH SALPINGECTOMY Bilateral 01/28/2021   Procedure: TOTAL LAPAROSCOPIC HYSTERECTOMY WITH SALPINGECTOMY;  Surgeon: Salvadore Dom, MD;  Location: [facility];  Service: Gynecology;  Laterality:  Bilateral;  . TUBAL LIGATION      Current Outpatient Medications  Medication Sig Dispense Refill  . acetaminophen (TYLENOL) 500 MG tablet Take 2 tablets (1,000 mg total) by mouth every 6 (six) hours. 30 tablet 0  . docusate sodium (COLACE) 100 MG capsule Take 1 capsule (100 mg total) by mouth 2 (two) times daily. 10 capsule 0  . ibuprofen (ADVIL) 800 MG tablet Take 1 tablet (800 mg total) by mouth every 8 (eight) hours as needed. 30 tablet 1  . oxyCODONE (ROXICODONE) 5 MG immediate release tablet Take 1 tablet (5 mg total) by mouth every 4 (four) hours as needed for severe pain. 30 tablet 0   No current facility-administered medications for this visit.     ALLERGIES: Bee venom  Family History  Problem Relation Age of Onset  . Breast cancer Mother        early 44s, no GT  . Hypertension Mother   . Diabetes Father   . Hypertension Father   . Stroke Father   . Heart attack Father   . Cancer Paternal Uncle        unk type  . Miscarriages / Stillbirths Neg  Hx   . Obesity Neg Hx   . Vision loss Neg Hx   . Varicose Veins Neg Hx     Social History   Socioeconomic History  . Marital status: Married    Spouse name: Not on file  . Number of children: Not on file  . Years of education: Not on file  . Highest education level: Not on file  Occupational History  . Not on file  Tobacco Use  . Smoking status: Former Smoker    Packs/day: 0.50    Types: Cigarettes    Quit date: 07/17/2018    Years since quitting: 2.5  . Smokeless tobacco: Never Used  Vaping Use  . Vaping Use: Never used  Substance and Sexual Activity  . Alcohol use: No  . Drug use: Yes    Types: Marijuana    Comment: once or twice a month  . Sexual activity: Yes    Partners: Male    Birth control/protection: Other-see comments, I.U.D.    Comment: BTL/Mirena  Other Topics Concern  . Not on file  Social History Narrative  . Not on file   Social Determinants of Health   Financial Resource Strain: Not on  file  Food Insecurity: Not on file  Transportation Needs: Not on file  Physical Activity: Not on file  Stress: Not on file  Social Connections: Not on file  Intimate Partner Violence: Not on file    Review of Systems  All other systems reviewed and are negative.   PHYSICAL EXAMINATION:    There were no vitals taken for this visit.    General appearance: alert, cooperative and appears stated age Abdomen: soft, non-tender; non distended, no masses,  no organomegaly Incisions: clean, dry and intact without erythema.   1. S/P laparoscopic hysterectomy Doing well Routine f/u

## 2021-02-10 ENCOUNTER — Encounter (HOSPITAL_COMMUNITY): Payer: Self-pay | Admitting: Emergency Medicine

## 2021-02-10 ENCOUNTER — Other Ambulatory Visit: Payer: Self-pay

## 2021-02-10 ENCOUNTER — Emergency Department (HOSPITAL_COMMUNITY)
Admission: EM | Admit: 2021-02-10 | Discharge: 2021-02-11 | Disposition: A | Discharge status: Home / Self Care | Payer: 59 | Attending: Emergency Medicine | Admitting: Emergency Medicine

## 2021-02-10 DIAGNOSIS — R110 Nausea: Secondary | ICD-10-CM | POA: Insufficient documentation

## 2021-02-10 DIAGNOSIS — J45909 Unspecified asthma, uncomplicated: Secondary | ICD-10-CM | POA: Insufficient documentation

## 2021-02-10 DIAGNOSIS — Z87891 Personal history of nicotine dependence: Secondary | ICD-10-CM | POA: Diagnosis not present

## 2021-02-10 DIAGNOSIS — Z6841 Body Mass Index (BMI) 40.0 and over, adult: Secondary | ICD-10-CM

## 2021-02-10 DIAGNOSIS — N939 Abnormal uterine and vaginal bleeding, unspecified: Secondary | ICD-10-CM | POA: Diagnosis not present

## 2021-02-10 DIAGNOSIS — R1030 Lower abdominal pain, unspecified: Secondary | ICD-10-CM | POA: Insufficient documentation

## 2021-02-10 LAB — CBC
HCT: 41.9 % (ref 36.0–46.0)
Hemoglobin: 13.4 g/dL (ref 12.0–15.0)
MCH: 27.7 pg (ref 26.0–34.0)
MCHC: 32 g/dL (ref 30.0–36.0)
MCV: 86.6 fL (ref 80.0–100.0)
Platelets: 327 10*3/uL (ref 150–400)
RBC: 4.84 MIL/uL (ref 3.87–5.11)
RDW: 17.3 % — ABNORMAL HIGH (ref 11.5–15.5)
WBC: 10.7 10*3/uL — ABNORMAL HIGH (ref 4.0–10.5)
nRBC: 0 % (ref 0.0–0.2)

## 2021-02-10 LAB — COMPREHENSIVE METABOLIC PANEL
ALT: 17 U/L (ref 0–44)
AST: 13 U/L — ABNORMAL LOW (ref 15–41)
Albumin: 3.7 g/dL (ref 3.5–5.0)
Alkaline Phosphatase: 58 U/L (ref 38–126)
Anion gap: 11 (ref 5–15)
BUN: 13 mg/dL (ref 6–20)
CO2: 22 mmol/L (ref 22–32)
Calcium: 9.2 mg/dL (ref 8.9–10.3)
Chloride: 106 mmol/L (ref 98–111)
Creatinine, Ser: 1.02 mg/dL — ABNORMAL HIGH (ref 0.44–1.00)
GFR, Estimated: >60 mL/min (ref >60)
Glucose, Bld: 96 mg/dL (ref 70–99)
Potassium: 4 mmol/L (ref 3.5–5.1)
Sodium: 139 mmol/L (ref 135–145)
Total Bilirubin: 0.3 mg/dL (ref 0.3–1.2)
Total Protein: 7.2 g/dL (ref 6.5–8.1)

## 2021-02-10 LAB — LIPASE, BLOOD: Lipase: 26 U/L (ref 11–51)

## 2021-02-10 MED ORDER — MORPHINE SULFATE (PF) 4 MG/ML IV SOLN: 4.0000 mg | Freq: Once | INTRAVENOUS | Status: DC

## 2021-02-10 MED ORDER — MORPHINE SULFATE (PF) 2 MG/ML IV SOLN
2.0000 mg | Freq: Once | INTRAVENOUS | Status: AC
  Administered 2021-02-10: 2 mg via INTRAVENOUS

## 2021-02-10 MED ORDER — ONDANSETRON HCL 4 MG/2ML IJ SOLN
4.0000 mg | Freq: Once | INTRAMUSCULAR | Status: AC
  Administered 2021-02-10: 4 mg via INTRAVENOUS
  Filled 2021-02-10: qty 2

## 2021-02-10 MED ORDER — IBUPROFEN 800 MG PO TABS: 800.0000 mg | ORAL_TABLET | Freq: Three times a day (TID) | ORAL | 0 refills | Status: AC

## 2021-02-10 MED ORDER — HYDROCODONE-ACETAMINOPHEN 5-325 MG PO TABS: 2.0000 | ORAL_TABLET | ORAL | 0 refills | Status: DC | PRN

## 2021-02-10 MED ORDER — MORPHINE SULFATE (PF) 2 MG/ML IV SOLN
2.0000 mg | Freq: Once | INTRAVENOUS | Status: DC
  Filled 2021-02-10: qty 1

## 2021-02-10 NOTE — H&P (Addendum)
Terri Walker is an 40 y.o. female G29P4014 Married female who presented to the ER this evening with bright red vaginal bleeding after having undergone TLH/bilateral salpingectomy/LOA, cystoscopy on 01/28/2021 with Dr. Sumner Boast.    Pt reports she was shopping yesterday evening around 9pm and lifted a heavy case of drinks several times--into the cart, paying for it, into her car and then home.  With the last life, she felt a pull and pain.  She rested overnight and took some motrin.  She did not have any bleeding.  She felt some better today and was up and very active, doing house work, moving some items around with her feet/legs, baking cupcakes and noted bleeding.  There wasn't anything specific that seemed to cause it except that she was very busy today.  Bleeding was initially heavy and bright red.  She had some clots and pain and decided to be seen in the ER.  She was initially seen by Sharyn Lull, PA, who reported bleeding was heavy with clots.  She attempted a pelvic exam but was unable to really assess pt due to bleeding, discomfort with exam, and obesity.  She called me to see if imaging or other procedure was needed.  I felt adequately assessing the vaginal cuff was the most important next step.  After coming into ER room and reviewing hx with pt, pt reported she'd done very well since surgery. She really hasn't been taking anything for pain except occasional motrin.  Bowel and bladder function was normal.  She's been having normal bowel movements.  Denies any fever.  She's felt really good overall until the last 24 hours.  She denies SOB, palpitations, dizziness or lightheadedness.  She is having cramping with the bleeding.    Operative note reviewed.  Pathology reviewed.  Pre op hb 13.5 and hb 13.4 in ER today.  WBC ct today 10.7 in ER. Creatinine 1.02 but pt reports she really hasn't had much to each or drink today.   Menstrual History: Patient's last menstrual period was  11/26/2020.    Past Medical History:  Diagnosis Date  . Abnormal Pap smear of cervix   . Abnormal uterine bleeding   . Anemia   . Anxiety   . Asthma    in the past  . Depression   . Family history of breast cancer   . Migraine without aura   . Ovarian cyst     Past Surgical History:  Procedure Laterality Date  . CESAREAN SECTION    . CESAREAN SECTION WITH BILATERAL TUBAL LIGATION    . COLPOSCOPY    . CYSTOSCOPY N/A 01/28/2021   Procedure: CYSTOSCOPY;  Surgeon: Salvadore Dom, MD;  Location: [facility];  Service: Gynecology;  Laterality: N/A;  . LAPAROSCOPIC LYSIS OF ADHESIONS N/A 01/28/2021   Procedure: LAPAROSCOPIC LYSIS OF ADHESIONS;  Surgeon: Salvadore Dom, MD;  Location: [facility];  Service: Gynecology;  Laterality: N/A;  . TOTAL LAPAROSCOPIC HYSTERECTOMY WITH SALPINGECTOMY Bilateral 01/28/2021   Procedure: TOTAL LAPAROSCOPIC HYSTERECTOMY WITH SALPINGECTOMY;  Surgeon: Salvadore Dom, MD;  Location: [facility] [facility];  Service: Gynecology;  Laterality: Bilateral;    Family History  Problem Relation Age of Onset  . Breast cancer Mother        early 84s, no GT  . Hypertension Mother   . Diabetes Father   . Hypertension Father   . Stroke Father   . Heart attack Father   . Cancer Paternal Uncle        unk  type  . Miscarriages / Stillbirths Neg Hx   . Obesity Neg Hx   . Vision loss Neg Hx   . Varicose Veins Neg Hx     Social History:  reports that she quit smoking about 2 years ago. Her smoking use included cigarettes. She smoked 0.50 packs per day. She has never used smokeless tobacco. She reports current drug use. Drug: Marijuana. She reports that she does not drink alcohol.  Allergies:  Allergies  Allergen Reactions  . Bee Venom Anaphylaxis    (Not in a hospital admission)   Review of Systems  Genitourinary: Positive for pelvic pain and vaginal bleeding.  All other systems reviewed and are negative.   Blood pressure (!) 162/106, pulse 88, temperature 98.2 F  (36.8 C), temperature source Oral, resp. rate 16, last menstrual period 11/26/2020, SpO2 100 %. Physical Exam Vitals reviewed. Exam conducted with a chaperone present.  Constitutional:      Appearance: Normal appearance.  HENT:     Head: Normocephalic and atraumatic.  Cardiovascular:     Rate and Rhythm: Normal rate and regular rhythm.     Pulses: Normal pulses.     Heart sounds: Normal heart sounds.  Pulmonary:     Effort: Pulmonary effort is normal.     Breath sounds: Normal breath sounds. No wheezing.  Abdominal:     General: Abdomen is flat. Bowel sounds are normal.     Palpations: Abdomen is soft.     Tenderness: There is abdominal tenderness (mid lower portion of abdomen). There is rebound. There is no guarding.     Comments: Incisions c/d/i, healing well  Genitourinary:    General: Normal vulva.     Exam position: Lithotomy position.     Labia:        Right: No rash, tenderness, lesion or injury.        Left: No rash, tenderness, lesion or injury.      Vagina: Normal. No lesions.     Comments: Cervix absent, small dark clots present, cuff intact and fully visualized with speculum exam.  Cuff non tender on digital exam.  No fullness.  No evidence of hematoma. Musculoskeletal:        General: Normal range of motion.     Cervical back: Normal range of motion.  Lymphadenopathy:     Lower Body: No right inguinal adenopathy. No left inguinal adenopathy.  Skin:    General: Skin is warm and dry.  Neurological:     General: No focal deficit present.     Mental Status: She is alert.  Psychiatric:        Mood and Affect: Mood normal.    Results for orders placed or performed during the hospital encounter of 02/10/21 (from the past 24 hour(s))  Lipase, blood     Status: None   Collection Time: 02/10/21  7:13 PM  Result Value Ref Range   Lipase 26 11 - 51 U/L  Comprehensive metabolic panel     Status: Abnormal   Collection Time: 02/10/21  7:13 PM  Result Value Ref Range    Sodium 139 135 - 145 mmol/L   Potassium 4.0 3.5 - 5.1 mmol/L   Chloride 106 98 - 111 mmol/L   CO2 22 22 - 32 mmol/L   Glucose, Bld 96 70 - 99 mg/dL   BUN 13 6 - 20 mg/dL   Creatinine, Ser 1.02 (H) 0.44 - 1.00 mg/dL   Calcium 9.2 8.9 - 10.3 mg/dL   Total Protein  7.2 6.5 - 8.1 g/dL   Albumin 3.7 3.5 - 5.0 g/dL   AST 13 (L) 15 - 41 U/L   ALT 17 0 - 44 U/L   Alkaline Phosphatase 58 38 - 126 U/L   Total Bilirubin 0.3 0.3 - 1.2 mg/dL   GFR, Estimated >60 >60 mL/min   Anion gap 11 5 - 15  CBC     Status: Abnormal   Collection Time: 02/10/21  7:13 PM  Result Value Ref Range   WBC 10.7 (H) 4.0 - 10.5 K/uL   RBC 4.84 3.87 - 5.11 MIL/uL   Hemoglobin 13.4 12.0 - 15.0 g/dL   HCT 41.9 36.0 - 46.0 %   MCV 86.6 80.0 - 100.0 fL   MCH 27.7 26.0 - 34.0 pg   MCHC 32.0 30.0 - 36.0 g/dL   RDW 17.3 (H) 11.5 - 15.5 %   Platelets 327 150 - 400 K/uL   nRBC 0.0 0.0 - 0.2 %    Assessment/Plan: S/p TLH/bilateral salpingectomy/LOA/cystoscopy 01/28/2021  Vaginal bleeding after heavy lifting that has subsided, exam c/w intact cuff  Obesity, BMI >50  1)  Pt will be discharged to home.  She will have follow up with Dr. [name] this week.  Strict precautions for no lifting, pulling, tugging given.  We discussed very specific activities that are and are not ok.  Complete pelvic rest also recommended.  Pt voices clear understanding of my recommendations.    2)  Motrin 800mg  q 8 hrs prn and Vicodin 5/325 1 tab ever 4 hours as needed #20/0RF sent to pharmacy.  I could not get orders to sign so Sharyn Lull, PA, in ER who initially assessed pt sent in these prescriptions.  3)  Will communicate with Dr. [name] in the morning to plan follow up for pt.   Megan Salon 02/11/2021, 12:02 AM

## 2021-02-10 NOTE — ED Triage Notes (Signed)
Pt had hysterectomy on 2/21.  After she picked up a case of water at the store yesterday she started having lower abd pain.  Reports heavy vaginal bleeding x 1 hour with clots.

## 2021-02-10 NOTE — H&P (Incomplete)
Terri Walker is an 40 y.o. female G103P4014 Married female who presented to the ER this evening with bright red vaginal bleeding after having undergone TLH/bilateral salpingectomy/LOA, cystoscopy on 01/28/2021 with Dr. Sumner Boast.    Pt reports she was shopping yesterday evening around 9pm and lifted a heavy case of drinks several times--into the cart, paying for it, into her car and then home.  With the last life, she felt a pull and pain.  She rested overnight and took some motrin.  She did not have any bleeding.  She felt some better today and was up and very active, doing house work, moving some items around with her feet/legs, baking cupcakes and noted bleeding.  There wasn't anything specific that seemed to cause it except that she was very busy today.  Bleeding was initially heavy and bright red.  She had some clots and pain and decided to be seen in the ER.  She was initially seen by Sharyn Lull, PA, who reported bleeding was heavy with clots.  She attempted a pelvic exam but was unable to really assess pt due to bleeding, discomfort with exam, and obesity.  She called me to see if imaging or other procedure was needed.  I felt adequately assessing the vaginal cuff was the most important next step.  After coming into ER room and reviewing hx with pt, pt reported she'd done very well since surgery. She really hasn't been taking anything for pain except occasional motrin.  Bowel and bladder function was normal.  She's been having normal bowel movements.  Denies any fever.  She's felt really good overall until the last 24 hours.  Operative note reviewed.  Pathology reviewed.  Pre op hb 13.5 and hb 13.4 in ER today.  WBC ct today 10.7 in ER. Creatinine 1.02 but pt reports she really hasn't had much to each or drink today.   Menstrual History: Patient's last menstrual period was 11/26/2020.    Past Medical History:  Diagnosis Date  . Abnormal Pap smear of cervix   . Abnormal uterine  bleeding   . Anemia   . Anxiety   . Asthma    in the past  . Depression   . Family history of breast cancer   . Migraine without aura   . Ovarian cyst     Past Surgical History:  Procedure Laterality Date  . CESAREAN SECTION    . CESAREAN SECTION WITH BILATERAL TUBAL LIGATION    . COLPOSCOPY    . CYSTOSCOPY N/A 01/28/2021   Procedure: CYSTOSCOPY;  Surgeon: Salvadore Dom, MD;  Location: [facility];  Service: Gynecology;  Laterality: N/A;  . LAPAROSCOPIC LYSIS OF ADHESIONS N/A 01/28/2021   Procedure: LAPAROSCOPIC LYSIS OF ADHESIONS;  Surgeon: Salvadore Dom, MD;  Location: [facility];  Service: Gynecology;  Laterality: N/A;  . TOTAL LAPAROSCOPIC HYSTERECTOMY WITH SALPINGECTOMY Bilateral 01/28/2021   Procedure: TOTAL LAPAROSCOPIC HYSTERECTOMY WITH SALPINGECTOMY;  Surgeon: Salvadore Dom, MD;  Location: [facility];  Service: Gynecology;  Laterality: Bilateral;    Family History  Problem Relation Age of Onset  . Breast cancer Mother        early 69s, no GT  . Hypertension Mother   . Diabetes Father   . Hypertension Father   . Stroke Father   . Heart attack Father   . Cancer Paternal Uncle        unk type  . Miscarriages / Stillbirths Neg Hx   . Obesity Neg Hx   . Vision  loss Neg Hx   . Varicose Veins Neg Hx     Social History:  reports that she quit smoking about 2 years ago. Her smoking use included cigarettes. She smoked 0.50 packs per day. She has never used smokeless tobacco. She reports current drug use. Drug: Marijuana. She reports that she does not drink alcohol.  Allergies:  Allergies  Allergen Reactions  . Bee Venom Anaphylaxis    (Not in a hospital admission)   Review of Systems  Genitourinary: Positive for pelvic pain and vaginal bleeding.  All other systems reviewed and are negative.   Blood pressure (!) 147/84, pulse 81, temperature 98.8 F (37.1 C), temperature source Oral, resp. rate 16, last menstrual period 11/26/2020, SpO2 97 %. Physical  Exam Vitals reviewed. Exam conducted with a chaperone present.  Constitutional:      Appearance: Normal appearance.  HENT:     Head: Normocephalic and atraumatic.  Cardiovascular:     Rate and Rhythm: Normal rate and regular rhythm.     Pulses: Normal pulses.     Heart sounds: Normal heart sounds.  Pulmonary:     Effort: Pulmonary effort is normal.     Breath sounds: Normal breath sounds. No wheezing.  Abdominal:     General: Abdomen is flat. Bowel sounds are normal.     Palpations: Abdomen is soft.     Tenderness: There is abdominal tenderness (mid lower portion of abdomen). There is rebound. There is no guarding.  Genitourinary:    General: Normal vulva.     Exam position: Lithotomy position.     Labia:        Right: No rash, tenderness, lesion or injury.        Left: No rash, tenderness, lesion or injury.      Vagina: Normal. No lesions.     Comments: Cervix absent, small dark clots present, cuff intact and fully visualized with speculum exam.  Cuff non tender on digital exam. Musculoskeletal:        General: Normal range of motion.     Cervical back: Normal range of motion.  Lymphadenopathy:     Lower Body: No right inguinal adenopathy. No left inguinal adenopathy.  Skin:    General: Skin is warm and dry.  Neurological:     General: No focal deficit present.     Mental Status: She is alert.  Psychiatric:        Mood and Affect: Mood normal.    Results for orders placed or performed during the hospital encounter of 02/10/21 (from the past 24 hour(s))  Lipase, blood     Status: None   Collection Time: 02/10/21  7:13 PM  Result Value Ref Range   Lipase 26 11 - 51 U/L  Comprehensive metabolic panel     Status: Abnormal   Collection Time: 02/10/21  7:13 PM  Result Value Ref Range   Sodium 139 135 - 145 mmol/L   Potassium 4.0 3.5 - 5.1 mmol/L   Chloride 106 98 - 111 mmol/L   CO2 22 22 - 32 mmol/L   Glucose, Bld 96 70 - 99 mg/dL   BUN 13 6 - 20 mg/dL   Creatinine,  Ser 1.02 (H) 0.44 - 1.00 mg/dL   Calcium 9.2 8.9 - 10.3 mg/dL   Total Protein 7.2 6.5 - 8.1 g/dL   Albumin 3.7 3.5 - 5.0 g/dL   AST 13 (L) 15 - 41 U/L   ALT 17 0 - 44 U/L   Alkaline Phosphatase 58  38 - 126 U/L   Total Bilirubin 0.3 0.3 - 1.2 mg/dL   GFR, Estimated >60 >60 mL/min   Anion gap 11 5 - 15  CBC     Status: Abnormal   Collection Time: 02/10/21  7:13 PM  Result Value Ref Range   WBC 10.7 (H) 4.0 - 10.5 K/uL   RBC 4.84 3.87 - 5.11 MIL/uL   Hemoglobin 13.4 12.0 - 15.0 g/dL   HCT 41.9 36.0 - 46.0 %   MCV 86.6 80.0 - 100.0 fL   MCH 27.7 26.0 - 34.0 pg   MCHC 32.0 30.0 - 36.0 g/dL   RDW 17.3 (H) 11.5 - 15.5 %   Platelets 327 150 - 400 K/uL   nRBC 0.0 0.0 - 0.2 %    No results found.  Assessment/Plan: S/p TLH/bilateral salpingectomy/LOA/cystoscopy 01/28/2021  Vaginal bleeding after heavy lifting that has subsided, exam c/w intact cuff  Obesity, BMI >50  1)  Pt will be discharged to home.  She will have follow up with Dr. [name] this week.  Strict precautions for no lifting, pulling, tugging given.  We discussed very specific activities that are and are not ok.  Complete pelvic rest also recommended.  Pt voices clear understanding of my recommendations.    2)  Motrin 800mg  q 8 hrs prn and Vicodin 5/325 1 tab ever 4 hours as needed #20/0RF sent to pharmacy.  I could not get orders to sign so Sharyn Lull, PA in ER who initially assessed pt sent in these prescriptions.  3)  Will communicate with Dr. [name] in the morning to plan follow up for pt.   Megan Salon 02/10/2021, 11:23 PM

## 2021-02-10 NOTE — Discharge Instructions (Signed)
Please take your pain medicines as needed.  Please follow-up with Dr. [name].  Return to the ER for any new or worsening symptoms.

## 2021-02-10 NOTE — ED Provider Notes (Signed)
[facility] EMERGENCY DEPARTMENT Provider Note   CSN: [id number] Arrival date & time: 02/10/21  1823     History Chief Complaint  Patient presents with  . vaginal bleeding s/p hysterectomy    Terri Walker is a 40 y.o. female.  HPI 40 year old female with a history of anemia, s/p laparoscopic hysterectomy with salpingectomy on 01/30/2021 with Dr. [name] presents to the ER with complaints of abdominal pain and vaginal bleeding x1 day.  Patient states that she was lifting heavy cases of water yesterday and felt a sudden sharp pain in her lower abdomen.  She states that the pain persisted, but did become a little bit better with ibuprofen.  She started to develop some vaginal bleeding this morning with large clots.  Pain has been persistent.  Endorses some nausea but no vomiting.  She denies any fevers, chills, upper abdominal pain     Past Medical History:  Diagnosis Date  . Abnormal Pap smear of cervix   . Abnormal uterine bleeding   . Anemia   . Anxiety   . Asthma    in the past  . Depression   . Family history of breast cancer   . Migraine without aura   . Ovarian cyst     Patient Active Problem List   Diagnosis Date Noted  . S/P laparoscopic hysterectomy 01/28/2021  . Family history of breast cancer   . IDA (iron deficiency anemia) 07/17/2020  . H/O tubal ligation 06/29/2018  . Anemia 06/29/2018  . Menometrorrhagia 06/29/2018    Past Surgical History:  Procedure Laterality Date  . ABDOMINAL HYSTERECTOMY    . CESAREAN SECTION     times 2  . CESAREAN SECTION WITH BILATERAL TUBAL LIGATION    . COLPOSCOPY    . CYSTOSCOPY N/A 01/28/2021   Procedure: CYSTOSCOPY;  Surgeon: Salvadore Dom, MD;  Location: [facility];  Service: Gynecology;  Laterality: N/A;  . LAPAROSCOPIC LYSIS OF ADHESIONS N/A 01/28/2021   Procedure: LAPAROSCOPIC LYSIS OF ADHESIONS;  Surgeon: Salvadore Dom, MD;  Location: [facility];  Service: Gynecology;  Laterality: N/A;  .  TOTAL LAPAROSCOPIC HYSTERECTOMY WITH SALPINGECTOMY Bilateral 01/28/2021   Procedure: TOTAL LAPAROSCOPIC HYSTERECTOMY WITH SALPINGECTOMY;  Surgeon: Salvadore Dom, MD;  Location: [facility];  Service: Gynecology;  Laterality: Bilateral;  . TUBAL LIGATION       OB History    Gravida  5   Para  4   Term  4   Preterm  0   AB  1   Living  4     SAB  1   IAB  0   Ectopic  0   Multiple  0   Live Births  4           Family History  Problem Relation Age of Onset  . Breast cancer Mother        early 17s, no GT  . Hypertension Mother   . Diabetes Father   . Hypertension Father   . Stroke Father   . Heart attack Father   . Cancer Paternal Uncle        unk type  . Miscarriages / Stillbirths Neg Hx   . Obesity Neg Hx   . Vision loss Neg Hx   . Varicose Veins Neg Hx     Social History   Tobacco Use  . Smoking status: Former Smoker    Packs/day: 0.50    Types: Cigarettes    Quit date: 07/17/2018  Years since quitting: 2.5  . Smokeless tobacco: Never Used  Vaping Use  . Vaping Use: Never used  Substance Use Topics  . Alcohol use: No  . Drug use: Yes    Types: Marijuana    Comment: once or twice a month    Home Medications Prior to Admission medications   Medication Sig Start Date End Date Taking? Authorizing Provider  ibuprofen (ADVIL) 800 MG tablet Take 1 tablet (800 mg total) by mouth every 8 (eight) hours as needed. 01/28/21   Salvadore Dom, MD    Allergies    Bee venom  Review of Systems   Review of Systems  Constitutional: Negative for chills and fever.  HENT: Negative for ear pain and sore throat.   Eyes: Negative for pain and visual disturbance.  Respiratory: Negative for cough and shortness of breath.   Cardiovascular: Negative for chest pain and palpitations.  Gastrointestinal: Positive for nausea. Negative for abdominal pain and vomiting.  Genitourinary: Positive for pelvic pain, vaginal bleeding and vaginal pain. Negative for  dysuria and hematuria.  Musculoskeletal: Negative for arthralgias and back pain.  Skin: Negative for color change and rash.  Neurological: Negative for seizures and syncope.  All other systems reviewed and are negative.   Physical Exam Updated Vital Signs BP (!) 147/84   Pulse 81   Temp 98.8 F (37.1 C) (Oral)   Resp 16   LMP 11/26/2020   SpO2 97%   Physical Exam Vitals reviewed.  Constitutional:      Appearance: Normal appearance.  HENT:     Head: Normocephalic and atraumatic.  Eyes:     General:        Right eye: No discharge.        Left eye: No discharge.     Extraocular Movements: Extraocular movements intact.     Conjunctiva/sclera: Conjunctivae normal.  Abdominal:     Tenderness: There is abdominal tenderness.     Comments: Mild lower abdominal tenderness  Genitourinary:    Comments: Pelvic exam performed with EMT at bedside.  Exam is limited, however there is a significant amount of pooling blood in the vaginal vault.  No visible bowel.  No visible tear.  Large dried clot on the external labia noted. Musculoskeletal:        General: No swelling. Normal range of motion.  Neurological:     General: No focal deficit present.     Mental Status: She is alert and oriented to person, place, and time.  Psychiatric:        Mood and Affect: Mood normal.        Behavior: Behavior normal.     ED Results / Procedures / Treatments   Labs (all labs ordered are listed, but only abnormal results are displayed) Labs Reviewed  COMPREHENSIVE METABOLIC PANEL - Abnormal; Notable for the following components:      Result Value   Creatinine, Ser 1.02 (*)    AST 13 (*)    All other components within normal limits  CBC - Abnormal; Notable for the following components:   WBC 10.7 (*)    RDW 17.3 (*)    All other components within normal limits  LIPASE, BLOOD  URINALYSIS, ROUTINE W REFLEX MICROSCOPIC    EKG None  Radiology No results found.  Procedures Procedures    Medications Ordered in ED Medications  ondansetron (ZOFRAN) injection 4 mg (has no administration in time range)  morphine 2 MG/ML injection 2 mg (2 mg Intravenous Given 02/10/21  2207)    ED Course  I have reviewed the triage vital signs and the nursing notes.  Pertinent labs & imaging results that were available during my care of the patient were reviewed by me and considered in my medical decision making (see chart for details).  Clinical Course as of 02/10/21 2317  Nancy Fetter Feb 11, 2112  2557 40 year old female with sudden onset of lower pain after recent hysterectomy with salpingectomy.  On arrival, vitals reassuring, no evidence of hypotension, tachycardia, fever.  Pelvic exam is limited given large body habitus and significant pooling of blood.  CMP and CBC unremarkable, hemoglobin stable at this time.  Spoke with Dr. Sabra Heck with OB/GYN who is concerned that the patient may have torn her cuff.  Plans to come in and evaluate the patient.  Patient was provided with analgesia, morphine, Zofran. [MB]  2316 Seen and evaluated by Dr. Sabra Heck.  She does not think that she tore her cuff, reports that the patient is safe to go home.  She will send her home with some pain management.  She discuss strict return precautions.  Will arrange follow-up with Dr. [name].  Patient voices understanding is agreeable.  Stable for discharge.  This was a shared visit with my supervising physician Dr. Roslynn Amble who independently saw and evaluated the patient & provided guidance in evaluation/management/disposition ,in agreement with care  [MB]    Clinical Course User Index [MB] Lyndel Safe   MDM Rules/Calculators/A&P                           Final Clinical Impression(s) / ED Diagnoses Final diagnoses:  None    Rx / DC Orders ED Discharge Orders    None       Lyndel Safe 02/10/21 2317    Lucrezia Starch, MD 02/12/21 [phone]

## 2021-02-11 ENCOUNTER — Telehealth: Payer: Self-pay | Admitting: Obstetrics and Gynecology

## 2021-02-11 DIAGNOSIS — Z6841 Body Mass Index (BMI) 40.0 and over, adult: Secondary | ICD-10-CM

## 2021-02-11 NOTE — Telephone Encounter (Signed)
Spoke with patient scheduled appointment for March 10.

## 2021-02-11 NOTE — Telephone Encounter (Signed)
The patient was seen in the ER last night with vaginal bleeding. She is 2 weeks s/p TLH. Her bleeding had stopped and her cuff was intact. Please call her and see how she is doing. She should come in for a f/u visit later this week and lessen her activity.

## 2021-02-11 NOTE — ED Notes (Signed)
DC instructions reviewed with the pt.  PT verbalized understanding. PT DC.  

## 2021-02-11 NOTE — Telephone Encounter (Signed)
Called patient to check on her and she as sleeping, report she does still have a dull cramping feeling, unable to identify the bleeding as she was still in bed. Reports last night she had bleeding however it was not heavy, small amount on pad. I told her Dr.Jertson would like to see her this week and I will have appointments call her to schedule.   Can you call and schedule office visit with Dr.Jertson this week per her request.

## 2021-02-13 ENCOUNTER — Inpatient Hospital Stay (HOSPITAL_BASED_OUTPATIENT_CLINIC_OR_DEPARTMENT_OTHER): Payer: 59 | Admitting: Family

## 2021-02-13 ENCOUNTER — Inpatient Hospital Stay: Payer: 59 | Attending: Family

## 2021-02-13 ENCOUNTER — Other Ambulatory Visit: Payer: Self-pay

## 2021-02-13 ENCOUNTER — Encounter: Payer: Self-pay | Admitting: Family

## 2021-02-13 ENCOUNTER — Telehealth: Payer: Self-pay

## 2021-02-13 VITALS — BP 146/93 | HR 76 | Temp 98.6°F | Resp 18 | Ht 64.0 in | Wt 299.0 lb

## 2021-02-13 DIAGNOSIS — N921 Excessive and frequent menstruation with irregular cycle: Secondary | ICD-10-CM | POA: Diagnosis not present

## 2021-02-13 DIAGNOSIS — D500 Iron deficiency anemia secondary to blood loss (chronic): Secondary | ICD-10-CM | POA: Diagnosis not present

## 2021-02-13 DIAGNOSIS — N920 Excessive and frequent menstruation with regular cycle: Secondary | ICD-10-CM | POA: Insufficient documentation

## 2021-02-13 LAB — IRON AND TIBC
Iron: 73 ug/dL (ref 41–142)
Saturation Ratios: 25 % (ref 21–57)
TIBC: 288 ug/dL (ref 236–444)
UIBC: 215 ug/dL (ref 120–384)

## 2021-02-13 LAB — CBC WITH DIFFERENTIAL (CANCER CENTER ONLY)
Abs Immature Granulocytes: 0.09 10*3/uL — ABNORMAL HIGH (ref 0.00–0.07)
Basophils Absolute: 0 10*3/uL (ref 0.0–0.1)
Basophils Relative: 0 %
Eosinophils Absolute: 0.2 10*3/uL (ref 0.0–0.5)
Eosinophils Relative: 3 %
HCT: 41.1 % (ref 36.0–46.0)
Hemoglobin: 13.2 g/dL (ref 12.0–15.0)
Immature Granulocytes: 1 %
Lymphocytes Relative: 26 %
Lymphs Abs: 2 10*3/uL (ref 0.7–4.0)
MCH: 27.6 pg (ref 26.0–34.0)
MCHC: 32.1 g/dL (ref 30.0–36.0)
MCV: 86 fL (ref 80.0–100.0)
Monocytes Absolute: 0.4 10*3/uL (ref 0.1–1.0)
Monocytes Relative: 5 %
Neutro Abs: 5 10*3/uL (ref 1.7–7.7)
Neutrophils Relative %: 65 %
Platelet Count: 288 10*3/uL (ref 150–400)
RBC: 4.78 MIL/uL (ref 3.87–5.11)
RDW: 16.8 % — ABNORMAL HIGH (ref 11.5–15.5)
WBC Count: 7.7 10*3/uL (ref 4.0–10.5)
nRBC: 0 % (ref 0.0–0.2)

## 2021-02-13 LAB — RETICULOCYTES
Immature Retic Fract: 14.3 % (ref 2.3–15.9)
RBC.: 4.77 MIL/uL (ref 3.87–5.11)
Retic Count, Absolute: 87.3 10*3/uL (ref 19.0–186.0)
Retic Ct Pct: 1.8 % (ref 0.4–3.1)

## 2021-02-13 LAB — FERRITIN: Ferritin: 150 ng/mL (ref 11–307)

## 2021-02-13 NOTE — Telephone Encounter (Signed)
Called and left a vm with f/u appt per 02/13/21 los   Terri Walker

## 2021-02-13 NOTE — Progress Notes (Signed)
Hematology and Oncology Follow Up Visit  Terri Walker [id number] 11/01/1981 40 y.o. 02/13/2021   Principle Diagnosis:  Iron deficiency secondary to heavy cycles  Past Therapy: Total hysterectomy on 01/28/2021  Current Therapy: IV iron as indicated   Interim History:  Terri Walker is here today for follow-up. She had her total hysterectomy on 01/28/2021. She did well with surgery but she strained a stitch and had some bleeding this past Saturday. She was evaluated in the ED by Terri Walker and discharged home. She started spotting again yesterday and has an appointment for follow-up with Terri Walker tomorrow.  No other blood loss noted. No bruising or petechiae.  She still has some soreness in the abdomen from surgery. She is taking it easy at home and not doing any heavy lifting.  No fever, chills, n/v, cough, rash, dizziness, SOB, chest pain, palpitationsor changes in bowel or bladder habits.   She is taking Colace daily to prevent having to strain.  No swelling, tenderness, numbness or tingling in her extremities.  No falls or syncope to report. She has maintained a good appetite and is staying well hydrated. Her weight is stable.    ECOG Performance Status: 1 - Symptomatic but completely ambulatory  Medications:  Allergies as of 02/13/2021      Reactions   Bee Venom Anaphylaxis      Medication List       Accurate as of February 13, 2021  9:43 AM. If you have any questions, ask your nurse or doctor.        HYDROcodone-acetaminophen 5-325 MG tablet Commonly known as: NORCO/VICODIN Take 2 tablets by mouth every 4 (four) hours as needed.   ibuprofen 800 MG tablet Commonly known as: ADVIL Take 1 tablet (800 mg total) by mouth 3 (three) times daily.       Allergies:  Allergies  Allergen Reactions  . Bee Venom Anaphylaxis    Past Medical History, Surgical history, Social history, and Family History were reviewed and updated.  Review of  Systems: All other 10 point review of systems is negative.   Physical Exam:  height is 5\' 4"  (1.626 m) and weight is 299 lb (135.6 kg). Her oral temperature is 98.6 F (37 C). Her blood pressure is 146/93 (abnormal) and her pulse is 76. Her respiration is 18 and oxygen saturation is 100%.   Wt Readings from Last 3 Encounters:  02/13/21 299 lb (135.6 kg)  02/04/21 290 lb (131.5 kg)  01/28/21 291 lb (132 kg)    Ocular: Sclerae unicteric, pupils equal, round and reactive to light Ear-nose-throat: Oropharynx clear, dentition fair Lymphatic: No cervical or supraclavicular adenopathy Lungs no rales or rhonchi, good excursion bilaterally Heart regular rate and rhythm, no murmur appreciated Abd soft, nontender, positive bowel sounds MSK no focal spinal tenderness, no joint edema Neuro: non-focal, well-oriented, appropriate affect Breasts: Deferred   Lab Results  Component Value Date   WBC 7.7 02/13/2021   HGB 13.2 02/13/2021   HCT 41.1 02/13/2021   MCV 86.0 02/13/2021   PLT 288 02/13/2021   Lab Results  Component Value Date   FERRITIN 29 11/15/2020   IRON 44 11/15/2020   TIBC 371 11/15/2020   UIBC 327 11/15/2020   IRONPCTSAT 12 (L) 11/15/2020   Lab Results  Component Value Date   RETICCTPCT 1.8 02/13/2021   RBC 4.78 02/13/2021   No results found for: KPAFRELGTCHN, LAMBDASER, KAPLAMBRATIO No results found for: IGGSERUM, IGA, IGMSERUM No results found for: TOTALPROTELP, ALBUMINELP, A1GS, A2GS, BETS, BETA2SER,  Danise Edge, SPEI   Chemistry      Component Value Date/Time   NA 139 02/10/2021 1913   NA 138 06/01/2020 0905   K 4.0 02/10/2021 1913   CL 106 02/10/2021 1913   CO2 22 02/10/2021 1913   BUN 13 02/10/2021 1913   BUN 14 06/01/2020 0905   CREATININE 1.02 (H) 02/10/2021 1913   CREATININE 0.82 07/17/2020 0822      Component Value Date/Time   CALCIUM 9.2 02/10/2021 1913   ALKPHOS 58 02/10/2021 1913   AST 13 (L) 02/10/2021 1913   AST 8 (L) 07/17/2020 0822   ALT  17 02/10/2021 1913   ALT 7 07/17/2020 0822   BILITOT 0.3 02/10/2021 1913   BILITOT 0.3 07/17/2020 0822       Impression and Plan: Terri Walker is a very pleasant 40 yo [place] female with history of iron deficiency anemia secondary to heavy cycles.She had a total hysterectomy on 01/28/2021 and has had some bleeding, now spotting, with a strained stitch and follows up with Terri Walker tomorrow.  Iron studies are pending. We will replace if needed.  Follow-up in another 4 months.  She can contact our office with any questions or concerns.   [name], NP 3/9/20229:43 AM

## 2021-02-14 ENCOUNTER — Encounter: Payer: Self-pay | Admitting: Obstetrics and Gynecology

## 2021-02-14 ENCOUNTER — Ambulatory Visit (INDEPENDENT_AMBULATORY_CARE_PROVIDER_SITE_OTHER): Payer: 59 | Admitting: Obstetrics and Gynecology

## 2021-02-14 VITALS — BP 110/66 | HR 78 | Ht 64.25 in | Wt 294.0 lb

## 2021-02-14 DIAGNOSIS — N939 Abnormal uterine and vaginal bleeding, unspecified: Secondary | ICD-10-CM

## 2021-02-14 DIAGNOSIS — Z90710 Acquired absence of both cervix and uterus: Secondary | ICD-10-CM
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
## 2021-02-15 ENCOUNTER — Encounter: Payer: Self-pay | Admitting: Obstetrics and Gynecology

## 2021-02-15 ENCOUNTER — Ambulatory Visit (INDEPENDENT_AMBULATORY_CARE_PROVIDER_SITE_OTHER): Payer: 59 | Admitting: Obstetrics and Gynecology

## 2021-02-15 ENCOUNTER — Telehealth: Payer: Self-pay | Admitting: *Deleted

## 2021-02-15 ENCOUNTER — Other Ambulatory Visit: Payer: Self-pay

## 2021-02-15 VITALS — BP 116/68 | HR 94 | Ht 64.25 in | Wt 294.0 lb

## 2021-02-15 DIAGNOSIS — N939 Abnormal uterine and vaginal bleeding, unspecified: Secondary | ICD-10-CM

## 2021-02-15 DIAGNOSIS — Z90710 Acquired absence of both cervix and uterus: Secondary | ICD-10-CM

## 2021-02-15 NOTE — Progress Notes (Signed)
GYNECOLOGY  VISIT   HPI: 40 y.o.   Married Other or two or more races Hispanic or Latino  female   [phone] with Patient's last menstrual period was 11/26/2020.   here for evaluation of vaginal bleeding. The patient is 2+ weeks s/p TLH. She had vaginal bleeding over the weekend, spotting since then. Yesterday she was seen, no active bleeding was seen but the cuff was treated with silver nitrate. She states increased bleeding yesterday afternoon and this morning. Some lower abdominal cramping.   GYNECOLOGIC HISTORY: Patient's last menstrual period was 11/26/2020. Contraception:hysterectomy Menopausal hormone therapy: none        OB History    Gravida  5   Para  4   Term  4   Preterm  0   AB  1   Living  4     SAB  1   IAB  0   Ectopic  0   Multiple  0   Live Births  4              Patient Active Problem List   Diagnosis Date Noted  . BMI 50.0-59.9, adult ([place]) 02/11/2021  . S/P laparoscopic hysterectomy 01/28/2021  . Family history of breast cancer   . IDA (iron deficiency anemia) 07/17/2020  . H/O tubal ligation 06/29/2018  . Anemia 06/29/2018  . Menometrorrhagia 06/29/2018    Past Medical History:  Diagnosis Date  . Abnormal Pap smear of cervix   . Abnormal uterine bleeding   . Anemia   . Anxiety   . Asthma    in the past  . Depression   . Family history of breast cancer   . Migraine without aura   . Ovarian cyst     Past Surgical History:  Procedure Laterality Date  . CESAREAN SECTION    . CESAREAN SECTION WITH BILATERAL TUBAL LIGATION    . COLPOSCOPY    . CYSTOSCOPY N/A 01/28/2021   Procedure: CYSTOSCOPY;  Surgeon: Salvadore Dom, MD;  Location: [place];  Service: Gynecology;  Laterality: N/A;  . LAPAROSCOPIC LYSIS OF ADHESIONS N/A 01/28/2021   Procedure: LAPAROSCOPIC LYSIS OF ADHESIONS;  Surgeon: Salvadore Dom, MD;  Location: [place];  Service: Gynecology;  Laterality: N/A;  . TOTAL LAPAROSCOPIC HYSTERECTOMY WITH SALPINGECTOMY  Bilateral 01/28/2021   Procedure: TOTAL LAPAROSCOPIC HYSTERECTOMY WITH SALPINGECTOMY;  Surgeon: Salvadore Dom, MD;  Location: [place];  Service: Gynecology;  Laterality: Bilateral;    Current Outpatient Medications  Medication Sig Dispense Refill  . ibuprofen (ADVIL) 800 MG tablet Take 1 tablet (800 mg total) by mouth 3 (three) times daily. 30 tablet 0   No current facility-administered medications for this visit.     ALLERGIES: Bee venom  Family History  Problem Relation Age of Onset  . Breast cancer Mother        early 61s, no GT  . Hypertension Mother   . Diabetes Father   . Hypertension Father   . Stroke Father   . Heart attack Father   . Cancer Paternal Uncle        unk type  . Miscarriages / Stillbirths Neg Hx   . Obesity Neg Hx   . Vision loss Neg Hx   . Varicose Veins Neg Hx     Social History   Socioeconomic History  . Marital status: Married    Spouse name: Not on file  . Number of children: Not on file  . Years of education: Not on file  .  Highest education level: Not on file  Occupational History  . Not on file  Tobacco Use  . Smoking status: Former Smoker    Packs/day: 0.50    Types: Cigarettes    Quit date: 07/17/2018    Years since quitting: 2.5  . Smokeless tobacco: Never Used  Vaping Use  . Vaping Use: Never used  Substance and Sexual Activity  . Alcohol use: No  . Drug use: Yes    Types: Marijuana    Comment: once or twice a month  . Sexual activity: Yes    Partners: Male    Birth control/protection: Other-see comments, I.U.D.    Comment: BTL/Mirena  Other Topics Concern  . Not on file  Social History Narrative  . Not on file   Social Determinants of Health   Financial Resource Strain: Not on file  Food Insecurity: Not on file  Transportation Needs: Not on file  Physical Activity: Not on file  Stress: Not on file  Social Connections: Not on file  Intimate Partner Violence: Not on file    ROS  PHYSICAL EXAMINATION:     BP 116/68   Pulse 94   Ht 5' 4.25" (1.632 m)   Wt 294 lb (133.4 kg)   LMP 11/26/2020   SpO2 97%   BMI 50.07 kg/m     General appearance: alert, cooperative and appears stated age   Pelvic: External genitalia:  no lesions              Urethra:  normal appearing urethra with no masses, tenderness or lesions              Bartholins and Skenes: normal                 Vagina: small amount of blood in the vagina. No active bleeding from the vaginal cuff. The cuff was wiped with a swab, watched for several minutes with no bleeding. Intact visually and with BM exam. Mild discomfort with BM exam, no mass. After inspection, then BM exam the speculum was replaced. Still no bleeding. The cuff was treated with silver nitrate and the monsels was applied.               Cervix: absent              Bimanual Exam:  Uterus:  uterus absent              Adnexa: no mass, fullness, tenderness               Chaperone was present for exam.  1. S/P laparoscopic hysterectomy Having vaginal bleeding off and on since the weekend  2. Vaginal bleeding No active bleeding on exam, cuff intact Cuff treated with silver nitrate and monsels She will rest over the weekend Call with any concerns.

## 2021-02-15 NOTE — Telephone Encounter (Signed)
Have her come right in

## 2021-02-15 NOTE — Telephone Encounter (Signed)
Pt called with complaints of heavy vaginal bleeding, with cramps severity 4,  changing pad every hour. Would like to know if this is normal or abnormal. Pt was here yesterday regarding  bleeding. Will route to Provider for advice/recommendations.

## 2021-02-15 NOTE — Telephone Encounter (Signed)
Pt has made appt and will be seen today.

## 2021-02-18 ENCOUNTER — Telehealth: Payer: Self-pay | Admitting: *Deleted

## 2021-02-18 NOTE — Telephone Encounter (Signed)
Patient called S/P laparoscopic on 01/28/21, patient said she is doing well, no more heavy bleeding. She is still having spotting, patient said she now noticed the color of the spotting is now black. Reports she can see it on the pad and when wiping she asked could this be the silver nitrate? States she is not freaking out now, but she would like to know what this could be. Please advise

## 2021-02-26 ENCOUNTER — Other Ambulatory Visit: Payer: Self-pay

## 2021-02-26 ENCOUNTER — Ambulatory Visit (INDEPENDENT_AMBULATORY_CARE_PROVIDER_SITE_OTHER): Payer: 59 | Admitting: Obstetrics and Gynecology

## 2021-02-26 ENCOUNTER — Encounter: Payer: Self-pay | Admitting: Obstetrics and Gynecology

## 2021-02-26 VITALS — BP 140/80 | HR 93 | Ht 64.25 in | Wt 294.2 lb

## 2021-02-26 DIAGNOSIS — Z90710 Acquired absence of both cervix and uterus: Secondary | ICD-10-CM

## 2021-02-26 NOTE — Progress Notes (Signed)
GYNECOLOGY  VISIT   HPI: 40 y.o.   Married Other or two or more races Hispanic or Latino  female   [phone] with Patient's last menstrual period was 11/26/2020.   here for 4 week post op, S/P TLH/BS. Pathology was benign. No bleeding or pain to report.  GYNECOLOGIC HISTORY: Patient's last menstrual period was 11/26/2020. Contraception: hysterectomy  Menopausal hormone therapy: none         OB History    Gravida  5   Para  4   Term  4   Preterm  0   AB  1   Living  4     SAB  1   IAB  0   Ectopic  0   Multiple  0   Live Births  4              Patient Active Problem List   Diagnosis Date Noted  . BMI 50.0-59.9, adult ([place]) 02/11/2021  . S/P laparoscopic hysterectomy 01/28/2021  . Family history of breast cancer   . IDA (iron deficiency anemia) 07/17/2020  . H/O tubal ligation 06/29/2018  . Anemia 06/29/2018  . Menometrorrhagia 06/29/2018    Past Medical History:  Diagnosis Date  . Abnormal Pap smear of cervix   . Abnormal uterine bleeding   . Anemia   . Anxiety   . Asthma    in the past  . Depression   . Family history of breast cancer   . Migraine without aura   . Ovarian cyst     Past Surgical History:  Procedure Laterality Date  . CESAREAN SECTION    . CESAREAN SECTION WITH BILATERAL TUBAL LIGATION    . COLPOSCOPY    . CYSTOSCOPY N/A 01/28/2021   Procedure: CYSTOSCOPY;  Surgeon: Salvadore Dom, MD;  Location: [place];  Service: Gynecology;  Laterality: N/A;  . LAPAROSCOPIC LYSIS OF ADHESIONS N/A 01/28/2021   Procedure: LAPAROSCOPIC LYSIS OF ADHESIONS;  Surgeon: Salvadore Dom, MD;  Location: [place];  Service: Gynecology;  Laterality: N/A;  . TOTAL LAPAROSCOPIC HYSTERECTOMY WITH SALPINGECTOMY Bilateral 01/28/2021   Procedure: TOTAL LAPAROSCOPIC HYSTERECTOMY WITH SALPINGECTOMY;  Surgeon: Salvadore Dom, MD;  Location: [place];  Service: Gynecology;  Laterality: Bilateral;    Current Outpatient Medications  Medication Sig  Dispense Refill  . ibuprofen (ADVIL) 800 MG tablet Take 1 tablet (800 mg total) by mouth 3 (three) times daily. 30 tablet 0   No current facility-administered medications for this visit.     ALLERGIES: Bee venom  Family History  Problem Relation Age of Onset  . Breast cancer Mother        early 64s, no GT  . Hypertension Mother   . Diabetes Father   . Hypertension Father   . Stroke Father   . Heart attack Father   . Cancer Paternal Uncle        unk type  . Miscarriages / Stillbirths Neg Hx   . Obesity Neg Hx   . Vision loss Neg Hx   . Varicose Veins Neg Hx     Social History   Socioeconomic History  . Marital status: Married    Spouse name: Not on file  . Number of children: Not on file  . Years of education: Not on file  . Highest education level: Not on file  Occupational History  . Not on file  Tobacco Use  . Smoking status: Former Smoker    Packs/day: 0.50    Types: Cigarettes  Quit date: 07/17/2018    Years since quitting: 2.6  . Smokeless tobacco: Never Used  Vaping Use  . Vaping Use: Never used  Substance and Sexual Activity  . Alcohol use: No  . Drug use: Yes    Types: Marijuana    Comment: once or twice a month  . Sexual activity: Yes    Partners: Male    Birth control/protection: Other-see comments, I.U.D.    Comment: BTL/Mirena  Other Topics Concern  . Not on file  Social History Narrative  . Not on file   Social Determinants of Health   Financial Resource Strain: Not on file  Food Insecurity: Not on file  Transportation Needs: Not on file  Physical Activity: Not on file  Stress: Not on file  Social Connections: Not on file  Intimate Partner Violence: Not on file    Review of Systems  All other systems reviewed and are negative.   PHYSICAL EXAMINATION:    LMP 11/26/2020     General appearance: alert, cooperative and appears stated age Abdomen: soft, non-tender; non distended, no masses,  no organomegaly Incisions: clean, dry  and intact without erythema Pelvic: deferred (done on 02/15/21)  1. S/P laparoscopic hysterectomy Doing well Routine f/u

## 2021-02-28 ENCOUNTER — Telehealth: Payer: Self-pay

## 2021-02-28 NOTE — Telephone Encounter (Signed)
Back to work and sitting down most the day. Yesterday late afternoon she noticed spotting and rested yesterday evening   And it subsided.  Now for the last hour she has bright red spotting again. Just wearing panty liner.

## 2021-02-28 NOTE — Telephone Encounter (Signed)
If the spotting persists over the next month, she should come in for evaluation. Call if bleeding is heavy.

## 2021-03-01 ENCOUNTER — Encounter: Payer: Self-pay | Admitting: *Deleted

## 2021-03-01 NOTE — Telephone Encounter (Signed)
I called patient to inform her we could not send the letter to e-mail below. She received letter via my chart. I will leave copy at front desk for pick up by husband as well per patient request.

## 2021-03-01 NOTE — Telephone Encounter (Signed)
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 She asked if you would feel comfortable providing her with a note to work from home for a couple of weeks? Patient said she has a cramping feeling off /on at time and it helps when she can lay down at home. Patient said that way for the next couple of weeks if any heavier bleeding should occur she would be at home. I told her I would check to see if this is something you would approve. I did tell the patient, I couldn't promise you would approve this request, but I would ask.

## 2021-03-01 NOTE — Telephone Encounter (Signed)
Please give her a note to work from home for the next 2 weeks. She is only 4.5 weeks s/p laparoscopic hysterectomy. Thank you!

## 2021-03-01 NOTE — Telephone Encounter (Signed)
Patient informed letter emailed to patient with email address [email or web address]

## 2021-04-09 ENCOUNTER — Encounter: Payer: Self-pay | Admitting: Licensed Clinical Social Worker

## 2021-06-17 ENCOUNTER — Ambulatory Visit: Payer: 59 | Admitting: Family

## 2021-06-17 ENCOUNTER — Other Ambulatory Visit: Payer: 59

## 2021-11-04 ENCOUNTER — Encounter: Payer: Self-pay | Admitting: Anesthesiology

## 2021-11-25 NOTE — Progress Notes (Deleted)
40 y.o. [id number] Married Other or two or more races Hispanic or Latino female here for annual exam.      Patient's last menstrual period was 11/26/2020.          Sexually active: {yes no:314532}  The current method of family planning is status post hysterectomy.    Exercising: {yes no:314532}  {types:19826} Smoker:  {YES NO:22349}  Health Maintenance: Pap:  09/03/18 Wnl Hr HPV Neg  History of abnormal Pap:  yes f/u was normal  MMG:  09/08/18 Bi-rads 1 neg  BMD:   n/a Colonoscopy: none  TDaP:  2014  Gardasil: x1   reports that she quit smoking about 3 years ago. Her smoking use included cigarettes. She smoked an average of .5 packs per day. She has never used smokeless tobacco. She reports current drug use. Drug: Marijuana. She reports that she does not drink alcohol.  Past Medical History:  Diagnosis Date   Abnormal Pap smear of cervix    Abnormal uterine bleeding    Anemia    Anxiety    Asthma    in the past   Depression    Family history of breast cancer    Migraine without aura    Ovarian cyst     Past Surgical History:  Procedure Laterality Date   CESAREAN SECTION     CESAREAN SECTION WITH BILATERAL TUBAL LIGATION     COLPOSCOPY     CYSTOSCOPY N/A 01/28/2021   Procedure: CYSTOSCOPY;  Surgeon: Salvadore Dom, MD;  Location: [facility];  Service: Gynecology;  Laterality: N/A;   LAPAROSCOPIC LYSIS OF ADHESIONS N/A 01/28/2021   Procedure: LAPAROSCOPIC LYSIS OF ADHESIONS;  Surgeon: Salvadore Dom, MD;  Location: [facility];  Service: Gynecology;  Laterality: N/A;   TOTAL LAPAROSCOPIC HYSTERECTOMY WITH SALPINGECTOMY Bilateral 01/28/2021   Procedure: TOTAL LAPAROSCOPIC HYSTERECTOMY WITH SALPINGECTOMY;  Surgeon: Salvadore Dom, MD;  Location: [facility];  Service: Gynecology;  Laterality: Bilateral;    Current Outpatient Medications  Medication Sig Dispense Refill   ibuprofen (ADVIL) 800 MG tablet Take 1 tablet (800 mg total) by mouth 3 (three) times daily. 30 tablet 0    No current facility-administered medications for this visit.    Family History  Problem Relation Age of Onset   Breast cancer Mother        early 49s, no GT   Hypertension Mother    Diabetes Father    Hypertension Father    Stroke Father    Heart attack Father    Cancer Paternal Uncle        unk type   Miscarriages / Stillbirths Neg Hx    Obesity Neg Hx    Vision loss Neg Hx    Varicose Veins Neg Hx     Review of Systems  Exam:   LMP 11/26/2020   Weight change: @WEIGHTCHANGE @ Height:      Ht Readings from Last 3 Encounters:  02/26/21 5' 4.25" (1.632 m)  02/15/21 5' 4.25" (1.632 m)  02/14/21 5' 4.25" (1.632 m)    General appearance: alert, cooperative and appears stated age Head: Normocephalic, without obvious abnormality, atraumatic Neck: no adenopathy, supple, symmetrical, trachea midline and thyroid {CHL AMB PHY EX THYROID NORM DEFAULT:[phone]::"normal to inspection and palpation"} Lungs: clear to auscultation bilaterally Cardiovascular: regular rate and rhythm Breasts: {Exam; breast:13139::"normal appearance, no masses or tenderness"} Abdomen: soft, non-tender; non distended,  no masses,  no organomegaly Extremities: extremities normal, atraumatic, no cyanosis or edema Skin: Skin color, texture, turgor normal. No rashes  or lesions Lymph nodes: Cervical, supraclavicular, and axillary nodes normal. No abnormal inguinal nodes palpated Neurologic: Grossly normal   Pelvic: External genitalia:  no lesions              Urethra:  normal appearing urethra with no masses, tenderness or lesions              Bartholins and Skenes: normal                 Vagina: normal appearing vagina with normal color and discharge, no lesions              Cervix: {CHL AMB PHY EX CERVIX NORM DEFAULT:[phone]::"no lesions"}               Bimanual Exam:  Uterus:  {CHL AMB PHY EX UTERUS NORM DEFAULT:[id number]::"normal size, contour, position, consistency, mobility, non-tender"}               Adnexa: {CHL AMB PHY EX ADNEXA NO MASS DEFAULT:[phone]::"no mass, fullness, tenderness"}               Rectovaginal: Confirms               Anus:  normal sphincter tone, no lesions  *** chaperoned for the exam.  A:  Well Woman with normal exam  P:

## 2021-11-27 ENCOUNTER — Ambulatory Visit: Payer: 59 | Admitting: Obstetrics and Gynecology

## 2021-12-04 ENCOUNTER — Ambulatory Visit: Payer: 59 | Admitting: Obstetrics and Gynecology

## 2022-01-20 NOTE — Progress Notes (Unsigned)
41 y.o. [id number] Married Other or two or more races Hispanic or Latino female here for annual exam.      Patient's last menstrual period was 11/26/2020.          Sexually active: {yes no:314532}  The current method of family planning is {contraception:315051}.    Exercising: {yes no:314532}  {types:19826} Smoker:  no  Health Maintenance: Pap:  09-03-18 normal neg HPV History of abnormal Pap:  yes-follow up neg MMG:  09-08-18 normal BMD:   neg Colonoscopy: neg TDaP:  2014 Gardasil: ***   reports that she quit smoking about 3 years ago. Her smoking use included cigarettes. She smoked an average of .5 packs per day. She has never used smokeless tobacco. She reports current drug use. Drug: Marijuana. She reports that she does not drink alcohol.  Past Medical History:  Diagnosis Date   Abnormal Pap smear of cervix    Abnormal uterine bleeding    Anemia    Anxiety    Asthma    in the past   Depression    Family history of breast cancer    Migraine without aura    Ovarian cyst     Past Surgical History:  Procedure Laterality Date   CESAREAN SECTION     CESAREAN SECTION WITH BILATERAL TUBAL LIGATION     COLPOSCOPY     CYSTOSCOPY N/A 01/28/2021   Procedure: CYSTOSCOPY;  Surgeon: Salvadore Dom, MD;  Location: [facility];  Service: Gynecology;  Laterality: N/A;   LAPAROSCOPIC LYSIS OF ADHESIONS N/A 01/28/2021   Procedure: LAPAROSCOPIC LYSIS OF ADHESIONS;  Surgeon: Salvadore Dom, MD;  Location: [facility];  Service: Gynecology;  Laterality: N/A;   TOTAL LAPAROSCOPIC HYSTERECTOMY WITH SALPINGECTOMY Bilateral 01/28/2021   Procedure: TOTAL LAPAROSCOPIC HYSTERECTOMY WITH SALPINGECTOMY;  Surgeon: Salvadore Dom, MD;  [facility];  Service: Gynecology;  Laterality: Bilateral;    Current Outpatient Medications  Medication Sig Dispense Refill   ibuprofen (ADVIL) 800 MG tablet Take 1 tablet (800 mg total) by mouth 3 (three) times daily. 30 tablet 0   No current  facility-administered medications for this visit.    Family History  Problem Relation Age of Onset   Breast cancer Mother        early 72s, no GT   Hypertension Mother    Diabetes Father    Hypertension Father    Stroke Father    Heart attack Father    Cancer Paternal Uncle        unk type   Miscarriages / Stillbirths Neg Hx    Obesity Neg Hx    Vision loss Neg Hx    Varicose Veins Neg Hx     Review of Systems  Exam:   LMP 11/26/2020   Weight change: @WEIGHTCHANGE @ Height:      Ht Readings from Last 3 Encounters:  02/26/21 5' 4.25" (1.632 m)  02/15/21 5' 4.25" (1.632 m)  02/14/21 5' 4.25" (1.632 m)    General appearance: alert, cooperative and appears stated age Head: Normocephalic, without obvious abnormality, atraumatic Neck: no adenopathy, supple, symmetrical, trachea midline and thyroid {CHL AMB PHY EX THYROID NORM DEFAULT:[phone]::"normal to inspection and palpation"} Lungs: clear to auscultation bilaterally Cardiovascular: regular rate and rhythm Breasts: {Exam; breast:13139::"normal appearance, no masses or tenderness"} Abdomen: soft, non-tender; non distended,  no masses,  no organomegaly Extremities: extremities normal, atraumatic, no cyanosis or edema Skin: Skin color, texture, turgor normal. No rashes or lesions Lymph nodes: Cervical, supraclavicular, and axillary nodes normal. No abnormal  inguinal nodes palpated Neurologic: Grossly normal   Pelvic: External genitalia:  no lesions              Urethra:  normal appearing urethra with no masses, tenderness or lesions              Bartholins and Skenes: normal                 Vagina: normal appearing vagina with normal color and discharge, no lesions              Cervix: {CHL AMB PHY EX CERVIX NORM DEFAULT:[phone]::"no lesions"}               Bimanual Exam:  Uterus:  {CHL AMB PHY EX UTERUS NORM [phone]::"normal size, contour, position, consistency, mobility, non-tender"}              Adnexa:  {CHL AMB PHY EX ADNEXA NO MASS [phone]::"no mass, fullness, tenderness"}               Rectovaginal: Confirms               Anus:  normal sphincter tone, no lesions  *** chaperoned for the exam.  A:  Well Woman with normal exam  P:

## 2022-01-31 ENCOUNTER — Ambulatory Visit: Payer: 59 | Admitting: Obstetrics and Gynecology

## 2022-04-02 NOTE — Progress Notes (Deleted)
41 y.o. [id number] Married Other or two or more races Hispanic or Latino female here for annual exam.      Patient's last menstrual period was 11/26/2020.          Sexually active: {yes no:314532}  The current method of family planning is status post hysterectomy.    Exercising: {yes no:314532}  {types:19826} Smoker:  {YES NO:22349}  Health Maintenance: Pap:  09/03/18 WNL HR HPV Neg  History of abnormal Pap:  yes F/U Neg  MMG:  09-08-18 density C/BIRADS 1 negative  BMD:   none  Colonoscopy: none  TDaP:  03/08/13  Gardasil: none    reports that she quit smoking about 3 years ago. Her smoking use included cigarettes. She smoked an average of .5 packs per day. She has never used smokeless tobacco. She reports current drug use. Drug: Marijuana. She reports that she does not drink alcohol.  Past Medical History:  Diagnosis Date   Abnormal Pap smear of cervix    Abnormal uterine bleeding    Anemia    Anxiety    Asthma    in the past   Depression    Family history of breast cancer    Migraine without aura    Ovarian cyst     Past Surgical History:  Procedure Laterality Date   CESAREAN SECTION     CESAREAN SECTION WITH BILATERAL TUBAL LIGATION     COLPOSCOPY     CYSTOSCOPY N/A 01/28/2021   Procedure: CYSTOSCOPY;  Surgeon: Salvadore Dom, MD;  Location: [facility];  Service: Gynecology;  Laterality: N/A;   LAPAROSCOPIC LYSIS OF ADHESIONS N/A 01/28/2021   Procedure: LAPAROSCOPIC LYSIS OF ADHESIONS;  Surgeon: Salvadore Dom, MD;  Location: [facility];  Service: Gynecology;  Laterality: N/A;   TOTAL LAPAROSCOPIC HYSTERECTOMY WITH SALPINGECTOMY Bilateral 01/28/2021   Procedure: TOTAL LAPAROSCOPIC HYSTERECTOMY WITH SALPINGECTOMY;  Surgeon: Salvadore Dom, MD;  Location: [facility];  Service: Gynecology;  Laterality: Bilateral;    Current Outpatient Medications  Medication Sig Dispense Refill   ibuprofen (ADVIL) 800 MG tablet Take 1 tablet (800 mg total) by mouth 3 (three) times daily. 30  tablet 0   No current facility-administered medications for this visit.    Family History  Problem Relation Age of Onset   Breast cancer Mother        early 66s, no GT   Hypertension Mother    Diabetes Father    Hypertension Father    Stroke Father    Heart attack Father    Cancer Paternal Uncle        unk type   Miscarriages / Stillbirths Neg Hx    Obesity Neg Hx    Vision loss Neg Hx    Varicose Veins Neg Hx     Review of Systems  Exam:   LMP 11/26/2020   Weight change: '@WEIGHTCHANGE'$ @ Height:      Ht Readings from Last 3 Encounters:  02/26/21 5' 4.25" (1.632 m)  02/15/21 5' 4.25" (1.632 m)  02/14/21 5' 4.25" (1.632 m)    General appearance: alert, cooperative and appears stated age Head: Normocephalic, without obvious abnormality, atraumatic Neck: no adenopathy, supple, symmetrical, trachea midline and thyroid {CHL AMB PHY EX THYROID NORM DEFAULT:[phone]::"normal to inspection and palpation"} Lungs: clear to auscultation bilaterally Cardiovascular: regular rate and rhythm Breasts: {Exam; breast:13139::"normal appearance, no masses or tenderness"} Abdomen: soft, non-tender; non distended,  no masses,  no organomegaly Extremities: extremities normal, atraumatic, no cyanosis or edema Skin: Skin color, texture, turgor normal.  No rashes or lesions Lymph nodes: Cervical, supraclavicular, and axillary nodes normal. No abnormal inguinal nodes palpated Neurologic: Grossly normal   Pelvic: External genitalia:  no lesions              Urethra:  normal appearing urethra with no masses, tenderness or lesions              Bartholins and Skenes: normal                 Vagina: normal appearing vagina with normal color and discharge, no lesions              Cervix: {CHL AMB PHY EX CERVIX NORM DEFAULT:[phone]::"no lesions"}               Bimanual Exam:  Uterus:  {CHL AMB PHY EX UTERUS NORM DEFAULT:[phone]::"normal size, contour, position, consistency, mobility,  non-tender"}              Adnexa: {CHL AMB PHY EX ADNEXA NO MASS DEFAULT:[phone]::"no mass, fullness, tenderness"}               Rectovaginal: Confirms               Anus:  normal sphincter tone, no lesions  *** chaperoned for the exam.  A:  Well Woman with normal exam  P:

## 2022-04-08 ENCOUNTER — Ambulatory Visit: Payer: 59 | Admitting: Obstetrics and Gynecology

## 2022-04-08 DIAGNOSIS — Z0289 Encounter for other administrative examinations: Secondary | ICD-10-CM

## 2022-06-11 ENCOUNTER — Encounter (HOSPITAL_COMMUNITY): Payer: Self-pay | Admitting: Emergency Medicine

## 2022-06-11 ENCOUNTER — Emergency Department (HOSPITAL_COMMUNITY)
Admission: EM | Admit: 2022-06-11 | Discharge: 2022-06-11 | Disposition: A | Discharge status: Home / Self Care | Payer: 59 | Attending: Emergency Medicine | Admitting: Emergency Medicine

## 2022-06-11 ENCOUNTER — Other Ambulatory Visit: Payer: Self-pay

## 2022-06-11 ENCOUNTER — Emergency Department (HOSPITAL_COMMUNITY): Payer: 59

## 2022-06-11 DIAGNOSIS — K219 Gastro-esophageal reflux disease without esophagitis: Secondary | ICD-10-CM | POA: Diagnosis not present

## 2022-06-11 DIAGNOSIS — J45909 Unspecified asthma, uncomplicated: Secondary | ICD-10-CM | POA: Insufficient documentation

## 2022-06-11 DIAGNOSIS — R0789 Other chest pain: Secondary | ICD-10-CM | POA: Diagnosis present

## 2022-06-11 LAB — TROPONIN I (HIGH SENSITIVITY)
Troponin I (High Sensitivity): 6 ng/L (ref <18)
Troponin I (High Sensitivity): 4 ng/L (ref <18)

## 2022-06-11 LAB — BASIC METABOLIC PANEL
Anion gap: 8 (ref 5–15)
BUN: 8 mg/dL (ref 6–20)
CO2: 26 mmol/L (ref 22–32)
Calcium: 8.6 mg/dL — ABNORMAL LOW (ref 8.9–10.3)
Chloride: 102 mmol/L (ref 98–111)
Creatinine, Ser: 0.87 mg/dL (ref 0.44–1.00)
GFR, Estimated: >60 mL/min (ref >60)
Glucose, Bld: 100 mg/dL — ABNORMAL HIGH (ref 70–99)
Potassium: 4.5 mmol/L (ref 3.5–5.1)
Sodium: 136 mmol/L (ref 135–145)

## 2022-06-11 LAB — CBC
HCT: 43.5 % (ref 36.0–46.0)
Hemoglobin: 13.7 g/dL (ref 12.0–15.0)
MCH: 27.9 pg (ref 26.0–34.0)
MCHC: 31.5 g/dL (ref 30.0–36.0)
MCV: 88.6 fL (ref 80.0–100.0)
Platelets: 288 10*3/uL (ref 150–400)
RBC: 4.91 MIL/uL (ref 3.87–5.11)
RDW: 13.8 % (ref 11.5–15.5)
WBC: 9.8 10*3/uL (ref 4.0–10.5)
nRBC: 0 % (ref 0.0–0.2)

## 2022-06-11 LAB — I-STAT BETA HCG BLOOD, ED (MC, WL, AP ONLY): I-stat hCG, quantitative: <5 m[IU]/mL (ref <5)

## 2022-06-11 MED ORDER — FAMOTIDINE 20 MG PO TABS: 20.0000 mg | ORAL_TABLET | Freq: Two times a day (BID) | ORAL | 0 refills | Status: AC

## 2022-06-11 MED ORDER — ALUM & MAG HYDROXIDE-SIMETH 200-200-20 MG/5ML PO SUSP
30.0000 mL | Freq: Once | ORAL | Status: AC
  Administered 2022-06-11: 30 mL via ORAL
  Filled 2022-06-11: qty 30

## 2022-06-11 MED ORDER — LIDOCAINE VISCOUS HCL 2 % MT SOLN
15.0000 mL | Freq: Once | OROMUCOSAL | Status: AC
  Administered 2022-06-11: 15 mL via ORAL
  Filled 2022-06-11: qty 15

## 2022-06-11 MED ORDER — PANTOPRAZOLE SODIUM 40 MG PO TBEC: 40.0000 mg | DELAYED_RELEASE_TABLET | Freq: Every day | ORAL | 0 refills | Status: AC

## 2022-06-11 NOTE — ED Triage Notes (Signed)
Patient complains of chest heaviness, shortness of breath, nausea, and headache that started at approximately 1000 today. Patient is alert, oriented, ambulatory, and in no apparent distress at this time.

## 2022-06-11 NOTE — ED Provider Notes (Signed)
[facility] Provider Note   CSN: [id number] Arrival date & time: 06/11/22  1021     History  Chief Complaint  Patient presents with   Chest Pain    Terri Walker is a 41 y.o. female.   Chest Pain Associated symptoms: shortness of breath     41 year old female with medical history significant for asthma, anemia, anxiety, depression, GERD who presents to the emergency department with a chief complaint of chest discomfort.  The patient states that this morning at approximately 10:00 today she developed a burning in her epigastrium which was consistent with prior episodes of GERD.  She also endorsed at that time chest heaviness and shortness of breath with associated nausea.  She states that the heaviness has since resolved but she continues to endorse some mild burning in the epigastrium consistent with prior episodes of reflux.  She denies any other symptoms or complaints at this time.  No cough, chills, fever, history of DVT or PE, lower extremity swelling.  Home Medications Prior to Admission medications   Medication Sig Start Date End Date Taking? Authorizing Provider  famotidine (PEPCID) 20 MG tablet Take 1 tablet (20 mg total) by mouth 2 (two) times daily. 06/11/22  Yes Regan Lemming, MD  pantoprazole (PROTONIX) 40 MG tablet Take 1 tablet (40 mg total) by mouth daily. 06/11/22 07/11/22 Yes Regan Lemming, MD  ibuprofen (ADVIL) 800 MG tablet Take 1 tablet (800 mg total) by mouth 3 (three) times daily. 02/10/21   Garald Balding, PA-C      Allergies    Bee venom    Review of Systems   Review of Systems  Respiratory:  Positive for shortness of breath.   Cardiovascular:  Positive for chest pain.  All other systems reviewed and are negative.   Physical Exam Updated Vital Signs BP (!) 153/96   Pulse 71   Temp 98.4 F (36.9 C) (Oral)   Resp (!) 21   LMP 11/26/2020   SpO2 99%  Physical Exam Vitals and nursing note reviewed.   Constitutional:      General: She is not in acute distress.    Appearance: She is well-developed.  HENT:     Head: Normocephalic and atraumatic.  Eyes:     Conjunctiva/sclera: Conjunctivae normal.  Cardiovascular:     Rate and Rhythm: Normal rate and regular rhythm.     Heart sounds: No murmur heard. Pulmonary:     Effort: Pulmonary effort is normal. No respiratory distress.     Breath sounds: Normal breath sounds.  Abdominal:     Palpations: Abdomen is soft.     Tenderness: There is no abdominal tenderness.  Musculoskeletal:        General: No swelling.     Cervical back: Neck supple.     Right lower leg: No edema.     Left lower leg: No edema.  Skin:    General: Skin is warm and dry.     Capillary Refill: Capillary refill takes less than 2 seconds.  Neurological:     General: No focal deficit present.     Mental Status: She is alert and oriented to person, place, and time.  Psychiatric:        Mood and Affect: Mood normal.     ED Results / Procedures / Treatments   Labs (all labs ordered are listed, but only abnormal results are displayed) Labs Reviewed  BASIC METABOLIC PANEL - Abnormal; Notable for the following components:  Result Value   Glucose, Bld 100 (*)    Calcium 8.6 (*)    All other components within normal limits  CBC  I-STAT BETA HCG BLOOD, ED ([facility], [facility], [facility] ONLY)  TROPONIN I (HIGH SENSITIVITY)  TROPONIN I (HIGH SENSITIVITY)    EKG EKG Interpretation  Date/Time:  Wednesday June 11 2022 11:43:27 EDT Ventricular Rate:  76 PR Interval:  146 QRS Duration: 91 QT Interval:  383 QTC Calculation: 431 R Axis:   56 Text Interpretation: Sinus rhythm Abnormal R-wave progression, early transition Confirmed by Regan Lemming ([id number]) on 06/11/2022 12:26:58 PM  Radiology DG Chest 2 View  Result Date: 06/11/2022 CLINICAL DATA:  Chest pain and shortness of breath for 1 hour. EXAM: CHEST - 2 VIEW COMPARISON:  None Available. FINDINGS: The heart size and  mediastinal contours are within normal limits. Both lungs are clear. The visualized skeletal structures are unremarkable. IMPRESSION: No active cardiopulmonary disease. Electronically Signed   By: Abelardo Diesel M.D.   On: 06/11/2022 10:48    Procedures Procedures    Medications Ordered in ED Medications  alum & mag hydroxide-simeth (MAALOX/MYLANTA) 200-200-20 MG/5ML suspension 30 mL (30 mLs Oral Given 06/11/22 1304)    And  lidocaine (XYLOCAINE) 2 % viscous mouth solution 15 mL (15 mLs Oral Given 06/11/22 1304)    ED Course/ Medical Decision Making/ A&P                           Medical Decision Making Amount and/or Complexity of Data Reviewed Labs: ordered. Radiology: ordered.  Risk OTC drugs. Prescription drug management.   41 year old female with medical history significant for asthma, anemia, anxiety, depression, GERD who presents to the emergency department with a chief complaint of chest discomfort.  The patient states that this morning at approximately 10:00 today she developed a burning in her epigastrium which was consistent with prior episodes of GERD.  She also endorsed at that time chest heaviness and shortness of breath with associated nausea.  She states that the heaviness has since resolved but she continues to endorse some mild burning in the epigastrium consistent with prior episodes of reflux.  She denies any other symptoms or complaints at this time.  No cough, chills, fever, history of DVT or PE, lower extremity swelling.  On arrival, the patient was afebrile, not tachycardic or tachypneic, normotensive BP 134/74, saturating 98% on room air.  Sinus rhythm noted on cardiac telemetry.  Physical exam generally unremarkable.  Symptoms consistent with likely GERD.  Given the patient's age, will rule out ACS with delta troponins.  Initial EKG revealed sinus rhythm, ventricular rate 76, abnormal R wave progression, no acute ST segment changes.  Checks x-ray was performed and  negative for active cardiac or pulmonary disease.  The patient had laboratory evaluation significant for normal beta-hCG, CBC without a leukocytosis or anemia, BMP unremarkable, delta troponins normal.  The patient is PERC negative.  Low concern for PE.  Patient with low concern for ACS at this time.  Maalox and viscous lidocaine administered with subsequent resolution of symptoms.  With the burning discomfort, symptoms are most consistent with gastroesophageal reflux.  We will start the patient on Pepcid and Protonix and have the patient follow-up with outpatient gastroenterology.  She currently does not have a PCP.  Return precautions provided in the event of worsening symptoms.   Final Clinical Impression(s) / ED Diagnoses Final diagnoses:  Gastroesophageal reflux disease, unspecified whether esophagitis present  Rx / DC Orders ED Discharge Orders          Ordered    famotidine (PEPCID) 20 MG tablet  2 times daily        06/11/22 1427    pantoprazole (PROTONIX) 40 MG tablet  Daily        06/11/22 1427    Ambulatory referral to Gastroenterology        06/11/22 1428              Regan Lemming, MD 06/11/22 1432

## 2022-08-07 ENCOUNTER — Encounter: Payer: Self-pay | Admitting: Gastroenterology

## 2022-09-10 ENCOUNTER — Ambulatory Visit: Payer: 59 | Admitting: Gastroenterology

## 2022-10-20 ENCOUNTER — Ambulatory Visit: Payer: 59 | Admitting: Gastroenterology

## 2022-11-26 ENCOUNTER — Telehealth: Payer: Self-pay | Admitting: Gastroenterology

## 2022-11-26 ENCOUNTER — Ambulatory Visit: Payer: 59 | Admitting: Gastroenterology

## 2022-11-26 NOTE — Telephone Encounter (Signed)
Good Morning Dr. Candis Schatz,   Patient called stating that she would not be able to make it to her appointment with you today at 2:10 due to thinking her appointment was yesterday and coming to the office and finding out that it was for today. Patient stated that her boss would not let her off two days in a row and she needed to reschedule.    Patient was rescheduled for 1/24 at 10:30

## 2022-12-31 ENCOUNTER — Ambulatory Visit: Payer: 59 | Admitting: Gastroenterology

## 2023-04-16 ENCOUNTER — Emergency Department (HOSPITAL_COMMUNITY)
Admission: EM | Admit: 2023-04-16 | Discharge: 2023-04-16 | Disposition: A | Discharge status: Home / Self Care | Payer: 59 | Attending: Emergency Medicine | Admitting: Emergency Medicine

## 2023-04-16 ENCOUNTER — Emergency Department (HOSPITAL_COMMUNITY): Payer: 59

## 2023-04-16 DIAGNOSIS — G43909 Migraine, unspecified, not intractable, without status migrainosus: Secondary | ICD-10-CM | POA: Diagnosis not present

## 2023-04-16 DIAGNOSIS — I10 Essential (primary) hypertension: Secondary | ICD-10-CM | POA: Insufficient documentation

## 2023-04-16 DIAGNOSIS — R519 Headache, unspecified: Secondary | ICD-10-CM | POA: Diagnosis present

## 2023-04-16 MED ORDER — PROCHLORPERAZINE EDISYLATE 10 MG/2ML IJ SOLN
10.0000 mg | Freq: Once | INTRAMUSCULAR | Status: AC
  Administered 2023-04-16: 10 mg via INTRAVENOUS
  Filled 2023-04-16: qty 2

## 2023-04-16 MED ORDER — DIPHENHYDRAMINE HCL 50 MG/ML IJ SOLN
25.0000 mg | Freq: Once | INTRAMUSCULAR | Status: AC
  Administered 2023-04-16: 25 mg via INTRAVENOUS
  Filled 2023-04-16: qty 1

## 2023-04-16 MED ORDER — KETOROLAC TROMETHAMINE 15 MG/ML IJ SOLN
15.0000 mg | Freq: Once | INTRAMUSCULAR | Status: AC
  Administered 2023-04-16: 15 mg via INTRAVENOUS
  Filled 2023-04-16: qty 1

## 2023-04-16 MED ORDER — ACETAMINOPHEN 500 MG PO TABS
1000.0000 mg | ORAL_TABLET | Freq: Once | ORAL | Status: AC
  Administered 2023-04-16: 1000 mg via ORAL
  Filled 2023-04-16: qty 2

## 2023-04-16 MED ORDER — TETRACAINE HCL 0.5 % OP SOLN
1.0000 [drp] | Freq: Once | OPHTHALMIC | Status: AC
  Administered 2023-04-16: 1 [drp] via OPHTHALMIC

## 2023-04-16 MED ORDER — LACTATED RINGERS IV BOLUS
1000.0000 mL | Freq: Once | INTRAVENOUS | Status: AC
  Administered 2023-04-16: 1000 mL via INTRAVENOUS

## 2023-04-16 NOTE — Discharge Instructions (Signed)
You were seen today for headache and vision changes.  This is likely due to a migraine.  Your CT scan was reassuring.  You should follow-up with your primary care provider next week.  If you develop severe headache, weakness, numbness, persistent vision changes you should return to the ED.

## 2023-04-16 NOTE — ED Triage Notes (Signed)
Pt states she's been having migraines intermittently for the last several weeks. Went to bed last night at 2300 and had slight headache, but upon waking up at 0800 noticed it to be a little more intense, but at 0830 began having increased R sided headache and blurry vision in her R eye. No weakness noted during triage. Pt denies thunderclap headache.

## 2023-04-16 NOTE — ED Provider Notes (Signed)
[employer] [employer] EMERGENCY DEPARTMENT AT [facility] Provider Note   CSN: [id number] Arrival date & time: 04/16/23  1217     History  Chief Complaint  Patient presents with   Headache    Terri Walker is a 42 y.o. female. 42 year old female history of anxiety, depression, migraines presenting for headache.  Patient states she has a history of migraines are usually throbbing, bilateral.  She has had a migraine since Saturday, not sudden onset.  Initially was right-sided behind her eye.  It improved over the next day or so.  Headache gradually worsened today.  It is behind her right eye and associate with blurry vision and her whole visual field in her right eye.  She has no visual changes in her left eye.  No eye pain or pain with eye movement.  She has not had any trauma to her right eye.  She has not had any fevers or chills.  She has had mild nausea.  She has not had any trauma to her head.  She is not on anticoagulation.  No neck stiffness.  She has not had any ocular procedures or other history, she does not wear contacts.  No history of glaucoma as far she is aware.  No weakness, numbness, or gait issues.   Headache      Home Medications Prior to Admission medications   Medication Sig Start Date End Date Taking? Authorizing Provider  famotidine (PEPCID) 20 MG tablet Take 1 tablet (20 mg total) by mouth 2 (two) times daily. 06/11/22   Ernie Avena, MD  ibuprofen (ADVIL) 800 MG tablet Take 1 tablet (800 mg total) by mouth 3 (three) times daily. 02/10/21   Mare Ferrari, PA-C  pantoprazole (PROTONIX) 40 MG tablet Take 1 tablet (40 mg total) by mouth daily. 06/11/22 07/11/22  Ernie Avena, MD      Allergies    Bee venom    Review of Systems   Review of Systems  Eyes:  Positive for visual disturbance.  Neurological:  Positive for headaches.    Physical Exam Updated Vital Signs BP (!) 132/58   Pulse 62   Temp 98.4 F (36.9 C) (Oral)   Resp 18   LMP 11/26/2020    SpO2 100%  Physical Exam Vitals and nursing note reviewed.  Constitutional:      General: She is not in acute distress.    Appearance: She is well-developed.  HENT:     Head: Normocephalic and atraumatic.  Eyes:     General: Lids are normal. Lids are everted, no foreign bodies appreciated. No visual field deficit or scleral icterus.    Intraocular pressure: Right eye pressure is 10 mmHg. Left eye pressure is 12 mmHg. Measurements were taken using a handheld tonometer.    Extraocular Movements: Extraocular movements intact.     Right eye: Normal extraocular motion.     Left eye: Normal extraocular motion.     Conjunctiva/sclera: Conjunctivae normal.     Pupils: Pupils are equal, round, and reactive to light.     Comments: 20/200 R, 20/20 L  Cardiovascular:     Rate and Rhythm: Normal rate and regular rhythm.     Heart sounds: No murmur heard. Pulmonary:     Effort: Pulmonary effort is normal. No respiratory distress.     Breath sounds: Normal breath sounds.  Abdominal:     Palpations: Abdomen is soft.     Tenderness: There is no abdominal tenderness.  Musculoskeletal:  General: No swelling.     Cervical back: Normal range of motion and neck supple. No rigidity.  Skin:    General: Skin is warm and dry.     Capillary Refill: Capillary refill takes less than 2 seconds.  Neurological:     Mental Status: She is alert and oriented to person, place, and time.     GCS: GCS eye subscore is 4. GCS verbal subscore is 5. GCS motor subscore is 6.     Cranial Nerves: No cranial nerve deficit, dysarthria or facial asymmetry.     Sensory: No sensory deficit.     Motor: No weakness.     Coordination: Romberg sign negative. Coordination normal.     Gait: Gait normal.     Deep Tendon Reflexes: Reflexes normal.     Comments: Generalized blurry vision in the right eye.  No field cuts.  Left eye normal.  Psychiatric:        Mood and Affect: Mood normal.     ED Results / Procedures /  Treatments   Labs (all labs ordered are listed, but only abnormal results are displayed) Labs Reviewed - No data to display  EKG None  Radiology CT Head Wo Contrast  Result Date: 04/16/2023 CLINICAL DATA:  Headache, increasing frequency or severity. Right headache and right eye pain. EXAM: CT HEAD WITHOUT CONTRAST TECHNIQUE: Contiguous axial images were obtained from the base of the skull through the vertex without intravenous contrast. RADIATION DOSE REDUCTION: This exam was performed according to the departmental dose-optimization program which includes automated exposure control, adjustment of the mA and/or kV according to patient size and/or use of iterative reconstruction technique. COMPARISON:  None Available. FINDINGS: Brain: No acute intracranial hemorrhage. Gray-white differentiation is preserved. No hydrocephalus or extra-axial collection. No mass effect or midline shift. Vascular: No hyperdense vessel or unexpected calcification. Skull: No calvarial fracture or suspicious bone lesion. Skull base is unremarkable. Sinuses/Orbits: Unremarkable. Other: None. IMPRESSION: No acute intracranial abnormality. Electronically Signed   By: Orvan Falconer M.D.   On: 04/16/2023 15:55    Procedures Procedures    Medications Ordered in ED Medications  prochlorperazine (COMPAZINE) injection 10 mg (10 mg Intravenous Given 04/16/23 1610)  diphenhydrAMINE (BENADRYL) injection 25 mg (25 mg Intravenous Given 04/16/23 1609)  lactated ringers bolus 1,000 mL (0 mLs Intravenous Stopped 04/16/23 1830)  acetaminophen (TYLENOL) tablet 1,000 mg (1,000 mg Oral Given 04/16/23 1609)  tetracaine (PONTOCAINE) 0.5 % ophthalmic solution 1 drop (1 drop Both Eyes Given 04/16/23 1610)  ketorolac (TORADOL) 15 MG/ML injection 15 mg (15 mg Intravenous Given 04/16/23 1645)    ED Course/ Medical Decision Making/ A&P                             Medical Decision Making Risk OTC drugs. Prescription drug  management.   42 year old female with history of migraines presenting for 1 day of right-sided headache and blurry vision.  Vital signs reviewed notable for mild hypertension.  She has no focal deficits on exam.  She has mild blurry vision in the right eye.  Differential including complicated migraine, glaucoma, tension headache.  Her age and lack of risk factors is less consistent with temporal arteritis.  No deficits on exam, low concern for CVA.  However given change in headache obtain CT head and treat symptomatically.  She has no fever or signs of CNS infection.  No eye trauma, low concern for corneal injury.  Consider possible IIH  as well, though acuity seems less consistent with this.  She has no historical risk factors for optic neuritis.  No thunderclap headache or sudden onset headache concerning for subarachnoid hemorrhage.  CT scan reviewed, unremarkable.  Headache is slightly improved after initial medications, blurry vision is mildly improved.  Patient received IV Toradol.  After this headache has resolved and vision has returned to baseline.  She is ambulating without difficulty.  Given response to medication and blurred vision only occurring with headache, I suspect this is a complicated migraine.  I feel she is appropriate for close outpatient follow-up with her PCP and strict return precautions.  She was comfortable this plan.  She was discharged home with her son who was able to drive her home.        Final Clinical Impression(s) / ED Diagnoses Final diagnoses:  Migraine without status migrainosus, not intractable, unspecified migraine type    Rx / DC Orders ED Discharge Orders     None         Fulton Reek, MD 04/16/23 2326    Lorre Nick, MD 04/17/23 [phone]

## 2023-04-16 NOTE — ED Provider Notes (Signed)
I saw and evaluated the patient, reviewed the resident's note and I agree with the findings and plan.   42 year old female presents for right-sided headache and eye discomfort.  Denies any fever or chills.  No neurological deficits.  Does have history of migraines although she states this is different.  Head CT per interpretation review shows no acute findings.  Will treat patient for migraines and check eye pressures.   Lorre Nick, MD 04/16/23 1600

## 2023-04-16 NOTE — ED Provider Triage Note (Signed)
Emergency Medicine Provider Triage Evaluation Note  Terri Walker , a 42 y.o. female  was evaluated in triage.  Pt complains of headache and eye pain. Had a migraine starting on Saturday, went away Sunday, came back Monday -- associated photophobia, sensitivity to sound, nausea, vomiting. Usually migraines are in the forehead. This headache starting at 830 am is right sided with R sided eye pain, photophobia, blurry vision.   Review of Systems  Positive: Headache, blurry vision, photophobia, dizziness, nausea Negative: Vision loss, eye irritation/discharge  Physical Exam  BP (!) 155/116   Pulse 83   Temp 98.9 F (37.2 C) (Oral)   Resp (!) 28   LMP 11/26/2020   SpO2 99%  Gen:   Awake, no distress   Resp:  Normal effort  MSK:   Moves extremities without difficulty  Other:  No facial droop, no slurred speech, 5/5 strength in all extremities, PERRLA, EOMI somewhat limited due to pain, no nystagmus  Medical Decision Making  Medically screening exam initiated at 12:51 PM.  Appropriate orders placed.  Devinne Walker was informed that the remainder of the evaluation will be completed by another provider, this initial triage assessment does not replace that evaluation, and the importance of remaining in the ED until their evaluation is complete.  Workup initiated   Shadaya Marschner T, PA-C 04/16/23 1254

## 2024-06-08 ENCOUNTER — Encounter (HOSPITAL_COMMUNITY): Payer: Self-pay

## 2024-06-08 ENCOUNTER — Emergency Department (HOSPITAL_COMMUNITY)

## 2024-06-08 ENCOUNTER — Other Ambulatory Visit: Payer: Self-pay

## 2024-06-08 ENCOUNTER — Emergency Department (HOSPITAL_COMMUNITY)
Admission: EM | Admit: 2024-06-08 | Discharge: 2024-06-08 | Disposition: A | Discharge status: Home / Self Care | Attending: Emergency Medicine | Admitting: Emergency Medicine

## 2024-06-08 DIAGNOSIS — J020 Streptococcal pharyngitis: Secondary | ICD-10-CM | POA: Insufficient documentation

## 2024-06-08 DIAGNOSIS — J029 Acute pharyngitis, unspecified: Secondary | ICD-10-CM | POA: Diagnosis present

## 2024-06-08 LAB — CBC WITH DIFFERENTIAL/PLATELET
Abs Immature Granulocytes: 0.09 10*3/uL — ABNORMAL HIGH (ref 0.00–0.07)
Basophils Absolute: 0.1 10*3/uL (ref 0.0–0.1)
Basophils Relative: 0 %
Eosinophils Absolute: 0 10*3/uL (ref 0.0–0.5)
Eosinophils Relative: 0 %
HCT: 43 % (ref 36.0–46.0)
Hemoglobin: 13.7 g/dL (ref 12.0–15.0)
Immature Granulocytes: 1 %
Lymphocytes Relative: 11 %
Lymphs Abs: 2 10*3/uL (ref 0.7–4.0)
MCH: 28.1 pg (ref 26.0–34.0)
MCHC: 31.9 g/dL (ref 30.0–36.0)
MCV: 88.1 fL (ref 80.0–100.0)
Monocytes Absolute: 0.8 10*3/uL (ref 0.1–1.0)
Monocytes Relative: 5 %
Neutro Abs: 14.1 10*3/uL — ABNORMAL HIGH (ref 1.7–7.7)
Neutrophils Relative %: 83 %
Platelets: 235 10*3/uL (ref 150–400)
RBC: 4.88 MIL/uL (ref 3.87–5.11)
RDW: 14 % (ref 11.5–15.5)
WBC: 17.1 10*3/uL — ABNORMAL HIGH (ref 4.0–10.5)
nRBC: 0 % (ref 0.0–0.2)

## 2024-06-08 LAB — BASIC METABOLIC PANEL WITH GFR
Anion gap: 8 (ref 5–15)
BUN: 7 mg/dL (ref 6–20)
CO2: 28 mmol/L (ref 22–32)
Calcium: 8.7 mg/dL — ABNORMAL LOW (ref 8.9–10.3)
Chloride: 101 mmol/L (ref 98–111)
Creatinine, Ser: 1.01 mg/dL — ABNORMAL HIGH (ref 0.44–1.00)
GFR, Estimated: >60 mL/min (ref >60)
Glucose, Bld: 135 mg/dL — ABNORMAL HIGH (ref 70–99)
Potassium: 3.5 mmol/L (ref 3.5–5.1)
Sodium: 137 mmol/L (ref 135–145)

## 2024-06-08 LAB — GROUP A STREP BY PCR: Group A Strep by PCR: DETECTED — AB

## 2024-06-08 MED ORDER — IOHEXOL 350 MG/ML SOLN
75.0000 mL | Freq: Once | INTRAVENOUS | Status: AC | PRN
  Administered 2024-06-08: 75 mL via INTRAVENOUS

## 2024-06-08 MED ORDER — PENICILLIN G BENZATHINE 1200000 UNIT/2ML IM SUSY
1.2000 10*6.[IU] | PREFILLED_SYRINGE | Freq: Once | INTRAMUSCULAR | Status: AC
  Administered 2024-06-08: 1.2 10*6.[IU] via INTRAMUSCULAR
  Filled 2024-06-08: qty 2

## 2024-06-08 MED ORDER — DEXAMETHASONE SODIUM PHOSPHATE 10 MG/ML IJ SOLN
10.0000 mg | Freq: Once | INTRAMUSCULAR | Status: AC
Start: 2024-06-08 — End: 2024-06-08
  Administered 2024-06-08: 10 mg via INTRAVENOUS
  Filled 2024-06-08: qty 1

## 2024-06-08 MED ORDER — PENICILLIN G BENZATHINE 1200000 UNIT/2ML IM SUSY
1.2000 10*6.[IU] | PREFILLED_SYRINGE | Freq: Once | INTRAMUSCULAR | Status: DC
Start: 2024-06-08 — End: 2024-06-08

## 2024-06-08 MED ORDER — LIDOCAINE VISCOUS HCL 2 % MT SOLN
15.0000 mL | OROMUCOSAL | 0 refills | Status: AC | PRN
Start: 2024-06-08 — End: ?

## 2024-06-08 NOTE — ED Provider Triage Note (Signed)
 Emergency Medicine Provider Triage Evaluation Note  Terri Walker , a 43 y.o. female  was evaluated in triage.  Pt complains of subjective fever, sore throat, and trouble breathing since Monday.  Also reports associated trouble swallowing secondary to pain.  Review of Systems  Positive:  Negative: See above   Physical Exam  BP (!) 174/90 (BP Location: Right Arm)   Pulse 96   Temp 99 F (37.2 C)   Resp 20   Ht 5' 2 (1.575 m)   Wt 131.5 kg   LMP 11/26/2020   SpO2 98%   BMI 53.04 kg/m  Gen:   Awake, no distress   Resp:  Normal effort, no inspiratory stridor MSK:   Moves extremities without difficulty  Other:  3+ tonsillar hypertrophy with slight uvular shift.  Kissing tonsils.  Voice is abnormal.  Medical Decision Making  Medically screening exam initiated at 5:20 PM.  Appropriate orders placed.  Marsia Medina-Barrios was informed that the remainder of the evaluation will be completed by another provider, this initial triage assessment does not replace that evaluation, and the importance of remaining in the ED until their evaluation is complete.  Suspicious for PTA.  Will get CT scan with contrast   Theotis Cameron HERO, PA-C 06/08/24 1720

## 2024-06-08 NOTE — ED Triage Notes (Signed)
 Pt c.o sore throat, fever and migraine since Monday. Taking tylenol  OTC

## 2024-06-08 NOTE — Discharge Instructions (Signed)
 Please take Xylocaine  as needed for throat pain.  This will cause temporary numbing to the throat and help provide some appropriate pain control.  You could also take 600 mg of ibuprofen  for pain.  You can take Tylenol  for fever.  Your symptoms should get better in the coming days.  I would like for you to follow-up with your primary care doctor for further evaluation.  You may return to the emergency department for any worsening symptoms.

## 2024-06-08 NOTE — ED Provider Notes (Signed)
 [place] EMERGENCY DEPARTMENT AT [facility] Provider Note   CSN: [id number] Arrival date & time: 06/08/24  1611     Patient presents with: Sore Throat and Fever   Terri Walker is a 43 y.o. female patient who presents to the emergency department today for further evaluation of sore throat.  She has been complaining of subjective fever and sore throat since Monday.  She has been having some associated trouble breathing and trouble swallowing secondary to pain.  She denies any chest pain.    Sore Throat  Fever      Prior to Admission medications   Medication Sig Start Date End Date Taking? Authorizing Provider  lidocaine  (XYLOCAINE ) 2 % solution Use as directed 15 mLs in the mouth or throat as needed for mouth pain. 06/08/24  Yes Theotis, Quayshawn Nin M, PA-C  famotidine  (PEPCID ) 20 MG tablet Take 1 tablet (20 mg total) by mouth 2 (two) times daily. 06/11/22   Jerrol Agent, MD  ibuprofen  (ADVIL ) 800 MG tablet Take 1 tablet (800 mg total) by mouth 3 (three) times daily. 02/10/21   Vonn Hadassah LABOR, PA-C  pantoprazole  (PROTONIX ) 40 MG tablet Take 1 tablet (40 mg total) by mouth daily. 06/11/22 07/11/22  Jerrol Agent, MD    Allergies: Bee venom    Review of Systems  Constitutional:  Positive for fever.  All other systems reviewed and are negative.   Updated Vital Signs BP (!) 168/100 (BP Location: Right Wrist)   Pulse 91   Temp 99.7 F (37.6 C) (Oral)   Resp 14   Ht 5' 2 (1.575 m)   Wt 131.5 kg   LMP 11/26/2020   SpO2 97%   BMI 53.04 kg/m   Physical Exam Vitals and nursing note reviewed.  Constitutional:      General: She is not in acute distress.    Appearance: Normal appearance.  HENT:     Head: Normocephalic and atraumatic.     Mouth/Throat:     Comments: There is certainly some tonsillar hypertrophy.  Uvula is now midline.  Tongue is normal.  There is some tonsillar exudate as well. Eyes:     General:        Right eye: No discharge.        Left  eye: No discharge.  Cardiovascular:     Comments: Regular rate and rhythm.  S1/S2 are distinct without any evidence of murmur, rubs, or gallops.  Radial pulses are 2+ bilaterally.  Dorsalis pedis pulses are 2+ bilaterally.  No evidence of pedal edema. Pulmonary:     Comments: Clear to auscultation bilaterally.  Normal effort.  No respiratory distress.  No evidence of wheezes, rales, or rhonchi heard throughout. Abdominal:     General: Abdomen is flat. Bowel sounds are normal. There is no distension.     Tenderness: There is no abdominal tenderness. There is no guarding or rebound.  Musculoskeletal:        General: Normal range of motion.     Cervical back: Neck supple.  Skin:    General: Skin is warm and dry.     Findings: No rash.  Neurological:     General: No focal deficit present.     Mental Status: She is alert.  Psychiatric:        Mood and Affect: Mood normal.        Behavior: Behavior normal.     (all labs ordered are listed, but only abnormal results are displayed) Labs Reviewed  GROUP A  STREP BY PCR - Abnormal; Notable for the following components:      Result Value   Group A Strep by PCR DETECTED (*)    All other components within normal limits  CBC WITH DIFFERENTIAL/PLATELET - Abnormal; Notable for the following components:   WBC 17.1 (*)    Neutro Abs 14.1 (*)    Abs Immature Granulocytes 0.09 (*)    All other components within normal limits  BASIC METABOLIC PANEL WITH GFR - Abnormal; Notable for the following components:   Glucose, Bld 135 (*)    Creatinine, Ser 1.01 (*)    Calcium 8.7 (*)    All other components within normal limits    EKG: None  Radiology: CT Soft Tissue Neck W Contrast Result Date: 06/08/2024 CLINICAL DATA:  r/o peritonsillar abscess EXAM: CT NECK WITH CONTRAST TECHNIQUE: Multidetector CT imaging of the neck was performed using the standard protocol following the bolus administration of intravenous contrast. RADIATION DOSE REDUCTION: This  exam was performed according to the departmental dose-optimization program which includes automated exposure control, adjustment of the mA and/or kV according to patient size and/or use of iterative reconstruction technique. CONTRAST:  75mL OMNIPAQUE IOHEXOL 350 MG/ML SOLN COMPARISON:  None Available. FINDINGS: Pharynx and larynx: Edematous and enlarged tonsils bilaterally, compatible with tonsillitis. No discrete, drainable fluid collection. Normal appearance of the larynx. Salivary glands: No inflammation, mass, or stone. Thyroid: Normal. Lymph nodes: Enlarged upper cervical chain lymph nodes bilaterally. Vascular: Limited assessment due to non arterial contrast bolus timing. Major arteries are patent. Limited intracranial: Negative. Visualized orbits: Negative. Mastoids and visualized paranasal sinuses: Clear. Skeleton: No acute or aggressive process. Upper chest: Visualized lung apices are clear. IMPRESSION: 1. Findings compatible with tonsillitis. No discrete, drainable fluid collection. 2. Enlarged upper cervical chain lymph nodes, nonspecific but likely reactive given the above findings. Electronically Signed   By: Gilmore GORMAN Molt M.D.   On: 06/08/2024 20:33     Procedures   Medications Ordered in the ED  penicillin g benzathine (BICILLIN LA) 1200000 UNIT/2ML injection 1.2 Million Units (has no administration in time range)  dexamethasone  (DECADRON ) injection 10 mg (10 mg Intravenous Given 06/08/24 1752)  iohexol (OMNIPAQUE) 350 MG/ML injection 75 mL (75 mLs Intravenous Contrast Given 06/08/24 1902)    Clinical Course as of 06/08/24 2214  Wed Jun 08, 2024  2150 Group A Strep by PCR(!): DETECTED [HN]  2150 CT Soft Tissue Neck W Contrast 1. Findings compatible with tonsillitis. No discrete, drainable fluid collection. 2. Enlarged upper cervical chain lymph nodes, nonspecific but likely reactive given the above findings.   [HN]  2150 Already received dexamethasone  from triage.  [HN]  2211  CBC with Differential(!) There is evidence of leukocytosis. [CF]  2211 Basic metabolic panel(!) Mild elevation creatinine does not meet AKI criteria. [CF]  2211 Group A Strep by PCR if patient complains of sore throat.(!) Strep positive. [CF]  2211 CT Soft Tissue Neck W Contrast I personally ordered interpreted the study.  No evidence peritonsillar abscess. [CF]    Clinical Course User Index [CF] Theotis Cameron HERO, PA-C [HN] Franklyn Sid SAILOR, MD    Medical Decision Making Terri Walker is a 43 y.o. female patient who presents to the emergency department today for further evaluation of pharyngitis.  Patient is strep positive.  I initially saw this patient in triage gave her some dexamethasone .  She states this did not really improve her symptoms that much.  Will give her some Bicillin here.  I will send her  some viscous Xylocaine  to go home with.  She will continue taking ibuprofen  and/or Tylenol  for fever and pain.  Patient agreeable with plan.  CT scan did not reveal any significant signs of peritonsillar abscess.  Fever has resolved.  Strict return precautions were discussed.  Patient comfortable going home.  She is safe for discharge at this time.   Amount and/or Complexity of Data Reviewed Labs: ordered. Decision-making details documented in ED Course. Radiology: ordered. Decision-making details documented in ED Course.  Risk Prescription drug management.     Final diagnoses:  Strep pharyngitis    ED Discharge Orders          Ordered    lidocaine  (XYLOCAINE ) 2 % solution  As needed        06/08/24 2213               Theotis Cameron HERO, [place] 06/08/24 2214    Franklyn Sid SAILOR, MD 06/08/24 2228

## 2024-06-08 NOTE — ED Triage Notes (Signed)
 Pt to er, pt states that she is here for a fever and sore throat, pt has swelling and redness to tonsils with some white exudate.

## 2024-06-09 ENCOUNTER — Telehealth: Payer: Self-pay | Admitting: Surgery

## 2024-06-09 NOTE — Telephone Encounter (Signed)
 ED RNCM received call from patient stating her discharge medication were not received at her pharmacy.  Reviewed chart prescription noted, called prescription into [facility], patient notified. No further ED RN Case Management needs identified.  Albert Gosling RN, BSN CNOR ED RN Care Manager [phone]

## 2024-08-25 NOTE — Progress Notes (Signed)
 Subjective   08/25/2024  Chief Complaint  Patient presents with   Pre-op Exam    SG. Wt gain of 4lbs since LOV 08/09/2024. Nonfasting.    ERE:Wpjoo A Valma RIGGERS                    Referring MD: Valma Dom, PA-C  Chief Complaint: metabolic syndrome/morbid obesity, follow up from original consult to determine if appropriate weight loss surgical candidate.   HPI: Terri Walker is a 43 y.o. YO female here for follow up after completing preoperative process with [facility].  Patient has lost weight in the pre-operative process.  Patient has been compliant with pre-operative guidelines and requirements as documented.   Terri Walker  has a history of severe morbid obesity for many years. she currently weighs (!) 314 lb (142.4 kg) and her Body mass index is 54.07 kg/m. and has truncal/gynecoid obesity.  she has obesity-related medical problems including:  1. Morbid obesity (*)   2. Prediabetes   3. Hypertension, unspecified type   4. Hyperlipidemia, unspecified hyperlipidemia type    with resulting fatigue/joint pain/back pain. Each of these issues was discussed in detail as related to the upcoming bariatric surgical procedure, risks, and chances of resolution or not. she desires to lose weight to improve her overall health and quality of life.   Terri Walker has completed all program requirements for bariatric surgery.  Here to review surgical procedure, risk/benefits, pre-operative/post-operative dietary requirements and supplements. Preop CXR (if indicated)  reviewed and WNL for surgery. Preop EKG ( if indicated) reviewed and no contraindication for surgery or further cardiac workup needed. Preop labs (CMP,CBC) show no contraindication to surgery.   Patient has contacted patients who have previously had surgery within our program, completed support person note and personal letter on reason for desiring weight loss surgery.    EGD: wnl, H.  pylori negative  Significant HTN this am. Attributes to stress and recent loss of family member. Denies any CP or SOB. BP taken manually 154/108   ROS: After lengthy discussion with patient, she complains of:  Unexpected weight loss no Headaches yes baseline Swallowing problems No Chest pain no Shortness of breath no Hemoptysis no Productive cough no Irregular heart beats no Nausea no Vomiting no Constipation no Diarrhea no Bloody bowel movements no Dysuria no Blood in urine no Frequency no Hesitancy no Lower extremity swelling / edema yes Blood clots in legs no Abnormal bleeding no Use of blood thinners no Rashes or sores that won't heal no Poor circulation in the legs no Pain in legs while walking no    Patients denies recent steroid use. Patients denies history of latex allergy   Past Medical History:  Diagnosis Date   Anxiety    Depression    Migraine    Please see pertinent positives and negatives in History section as relayed by patient today  Past Surgical History:  Procedure Laterality Date   Cesarean section     Hysterectomy      Medications Ordered Prior to Encounter[1]  Allergies: Allergies[2]  Social History[3]  Family History  Problem Relation Age of Onset   Hypertension Mother    Breast cancer Mother    Diabetes Father      Objective   BP (!) 154/108 (BP Location: Right Upper Arm, Patient Position: Sitting) Comment: manual thigh cuff  Pulse 82   Ht 5' 3.9 (1.623 m)   Wt (!) 314 lb (142.4 kg)   SpO2 98%  Breastfeeding No   BMI 54.07 kg/m   chaperone is present for exam Physical Examination:  GENERAL ASSESSMENT: well developed and well nourished, obese  SKIN: normal color, no lesions, non-icteric, no petechiae, no ecchymoses  HEAD: normocephalic, atraumatic  EYES: non-icteric sclerae  EARS: normal external appearance  NOSE: normal external appearance and nares patent  MOUTH: moist mucosa, no dry mucosa, no  tongue coating, multiple teeth present and grossly normal  NECK: normal supple full range of motion and no adenopathy or masses, trachea midline, no carotid bruits, no JVD  CHEST: normal air exchange, no rales, no rhonchi, no wheezes, respiratory effort normal with no retractions  HEART: regular rate and rhythm, normal S1/S2, no murmurs, no thrills, no rubs, no gallops  ABDOMEN: soft, obese, non-distended, no masses, no hepatosplenomegaly, incisions are well healed with no herniation EXTREMITY: normal and symmetric movement, normal range of motion, no joint swelling  NEURO: strength normal and symmetric, sensory exam normal, gait normal Psycho/social: normal affect and behavior      Assessment    1. Morbid obesity (*)   2. Prediabetes   3. Hypertension, unspecified type   4. Hyperlipidemia, unspecified hyperlipidemia type     Plan   Terri Walker meets NIH guidelines for weight loss surgery. Body mass index is 54.07 kg/m..she has obesity-related medical problems including:  1. Morbid obesity (*)   2. Prediabetes   3. Hypertension, unspecified type   4. Hyperlipidemia, unspecified hyperlipidemia type       We have discussed the risks, benefits and alternatives and this patient along with our bariatric team, including myself, has chosen laparoscopic sleeve gastrectomy (56224) to be the most appropriate surgery.   Patient has decided to proceed with sleeve gastrectomy rather than a gastric bypass. It has been explained to pt at length that this could result in a lower chance of resolving her medical comorbidities (eg diabetes, hypertension, hyperlipidemia, etc) and possibly lower average weight loss. Patient also understands that GERD symptoms may occur or worsen after SG, as opposed to likely being helped after gastric bypass. We also discussed how regular exercise 4-5 days per week and hitting her protein target each day are an essential component of success after sleeve  gastrectomy The pt still prefers to proceed with SG.  Patient has completed our program's requirement for preoperative evaluation, is deemed an appropriate for weight loss surgery.  Surgery to be scheduled in the next 30 days.   Patient to begin the liver shrinking diet as outlined in surgical manual and AVS.   This consists of high protein and low carbohydrate, high fluid intake and limited sugars.  The patient is aware this is imperative to decrease peri-operative risk.      See below for risk and benefits which were discussed in detail with the patient.   Short term Risks and benefits including but not limited to bleeding, infection, leak of staple line , blood clots, need for an open rather than laparoscopic procedure reviewed.   In the unlikely event of a sleeve gastrectomy leak, the long term nature of healing was reviewed including the need for TPN, endoscopy, and several month healing time. GERD and sleeve gastrectomy reviewed. Possible need for hiatal hernia repair reviewed. Possible need for conversion to another surgery reviewed. Severe nausea reviewed in detail.  Critical need for frequent and early ambulation stressed. Need to avoid ASA, NSAIDS and steroids stressed. No steroids within three months of surgery.  We have reviewed that some patients struggle with the mental  aspects of having bariatric surgery and this struggle can manifest in many ways including nausea/vomiting, headaches and fatigue. In some instances chronic N/V may have no physical cause and after evaluation may point to a mental health etiology. In rare cases a bypass may be reversed if the patient simply cannot adapt to the new lifestyle. Mental health providers such as our counselors or psychiatrists may needed.    Risks of dehydration, chronic nausea, stenosis of gastric tube , and ulcers reviewed. Avoiding tannins such as cola, coffee, and tea reviewed.  No alcohol reviewed.    Long term nutritional deficiencies  and the importance of lifelong follow up were stressed.  Relatively short term research data for sleeve gastrectomy reviewed.Morbidity and mortality due to morbid obesity reviewed. Non operative medical and dietary management reviewed.   Importance of high protein diet and lifelong exercise reviewed. Resistance training stressed. Weight gain reviewed, especially if noncompliant with lifelong dietary and exercise requirement.  Invitation to support group both preoperatively and postoperatively extended.  Anesthesia risks reviewed but will be covered again in detail by the anesthesia service. All risks increase with increasing age as well as with a greater number of comorbid conditions.         [1] No current outpatient medications on file prior to visit.   No current facility-administered medications on file prior to visit.  [2] Allergies Allergen Reactions   Bee Venom Hives   Codeine Hives   Vinegar [Acetic Acid] Swelling  [3] Social History Socioeconomic History   Marital status: Divorced  Tobacco Use   Smoking status: Never    Passive exposure: Never   Smokeless tobacco: Never  Vaping Use   Vaping status: Never Used  Substance and Sexual Activity   Alcohol use: Never   Drug use: Yes    Types: Marijuana    Comment: Smokes pot twice a week

## 2025-01-09 ENCOUNTER — Other Ambulatory Visit: Payer: Self-pay

## 2025-01-09 ENCOUNTER — Emergency Department (HOSPITAL_COMMUNITY)

## 2025-01-09 ENCOUNTER — Encounter (HOSPITAL_COMMUNITY): Payer: Self-pay | Admitting: Emergency Medicine

## 2025-01-09 ENCOUNTER — Emergency Department (HOSPITAL_COMMUNITY)
Admission: EM | Admit: 2025-01-09 | Discharge: 2025-01-09 | Disposition: A | Discharge status: Home / Self Care | Source: Home / Self Care | Attending: Emergency Medicine | Admitting: Emergency Medicine

## 2025-01-09 DIAGNOSIS — R519 Headache, unspecified: Secondary | ICD-10-CM

## 2025-01-09 DIAGNOSIS — H539 Unspecified visual disturbance: Secondary | ICD-10-CM

## 2025-01-09 DIAGNOSIS — R0789 Other chest pain: Secondary | ICD-10-CM

## 2025-01-09 LAB — CBC
HCT: 44.5 % (ref 36.0–46.0)
Hemoglobin: 13.9 g/dL (ref 12.0–15.0)
MCH: 27.8 pg (ref 26.0–34.0)
MCHC: 31.2 g/dL (ref 30.0–36.0)
MCV: 89 fL (ref 80.0–100.0)
Platelets: 291 10*3/uL (ref 150–400)
RBC: 5 MIL/uL (ref 3.87–5.11)
RDW: 14 % (ref 11.5–15.5)
WBC: 10.8 10*3/uL — ABNORMAL HIGH (ref 4.0–10.5)
nRBC: 0 % (ref 0.0–0.2)

## 2025-01-09 LAB — SEDIMENTATION RATE: Sed Rate: 19 mm/h (ref 0–22)

## 2025-01-09 LAB — BASIC METABOLIC PANEL WITH GFR
Anion gap: 10 (ref 5–15)
BUN: 10 mg/dL (ref 6–20)
CO2: 26 mmol/L (ref 22–32)
Calcium: 9 mg/dL (ref 8.9–10.3)
Chloride: 102 mmol/L (ref 98–111)
Creatinine, Ser: 0.8 mg/dL (ref 0.44–1.00)
GFR, Estimated: >60 mL/min
Glucose, Bld: 99 mg/dL (ref 70–99)
Potassium: 3.8 mmol/L (ref 3.5–5.1)
Sodium: 138 mmol/L (ref 135–145)

## 2025-01-09 LAB — TROPONIN T, HIGH SENSITIVITY
Troponin T High Sensitivity: <6 ng/L (ref 0–19)
Troponin T High Sensitivity: <6 ng/L (ref 0–19)

## 2025-01-09 LAB — C-REACTIVE PROTEIN: CRP: 1.8 mg/dL — ABNORMAL HIGH

## 2025-01-09 MED ORDER — KETOROLAC TROMETHAMINE 15 MG/ML IJ SOLN
15.0000 mg | Freq: Once | INTRAMUSCULAR | Status: AC
  Administered 2025-01-09: 15 mg via INTRAVENOUS
  Filled 2025-01-09: qty 1

## 2025-01-09 MED ORDER — PROCHLORPERAZINE EDISYLATE 10 MG/2ML IJ SOLN
10.0000 mg | Freq: Once | INTRAMUSCULAR | Status: AC
  Administered 2025-01-09: 10 mg via INTRAVENOUS
  Filled 2025-01-09: qty 2

## 2025-01-09 MED ORDER — ACETAMINOPHEN 500 MG PO TABS
1000.0000 mg | ORAL_TABLET | Freq: Once | ORAL | Status: AC
  Administered 2025-01-09: 1000 mg via ORAL
  Filled 2025-01-09: qty 2

## 2025-01-09 MED ORDER — IOHEXOL 350 MG/ML SOLN
75.0000 mL | Freq: Once | INTRAVENOUS | Status: AC | PRN
  Administered 2025-01-09: 75 mL via INTRAVENOUS

## 2025-01-09 MED ORDER — DIPHENHYDRAMINE HCL 50 MG/ML IJ SOLN
25.0000 mg | Freq: Once | INTRAMUSCULAR | Status: AC
  Administered 2025-01-09: 25 mg via INTRAVENOUS
  Filled 2025-01-09: qty 1

## 2025-01-09 NOTE — Progress Notes (Signed)
 [facility] Video Visit   Patient ID:  Terri Walker is a 44 y.o. (DOB 1981/11/28) female Place of service: location other than patient home Patient has been advised as to the limitations and limited nature of physical exam due to nature of a video visit, the possibility of privacy risk in the use of a video visit, and that the healthcare provider may recommend visiting a healthcare clinic for in-person care and follow up.   Video Visit Assessment and Plan   1. Left-sided chest pain (Primary) 2. Essential hypertension    Acute onset of chest pain.  Patient unfortunately cannot be evaluated in office due to office being closed for inclement weather.  I advised patient that given her symptoms and most recent BP readings, she should seek urgent care evaluation in order to ensure this is not cardiac in etiology.  Patient agrees to plan as BP reading, EKG and physical exam are warranted.  Patient will schedule sooner follow-up.  Otherwise she will continue follow-up with our office as scheduled.  Current Medications[1]   Risk, benefits, and alternatives were provided through patient instructions given to the patient electronically and during the video interaction.  If any worsening symptoms or lack of improvement, the patient will seek immediate medical care.   Video Visit History  No chief complaint on file.    Patient presents today with concerns of acute onset of chest pain.  She states that symptoms began this morning when she awoke.  She states that it began in the center of her chest but now has moved to the left side of her chest, under her left breast.  She states that it is not related to her breast tissue.  She has had an accompanying headache.  Patient was last seen in the office on 01/05/2025.  At that time her blood pressure was significantly elevated at 184/112 on recheck.  Patient was initiated on amlodipine 5 mg and advised to quickly titrate over the next week to 10 mg.   She states that she has been taking amlodipine 5 mg compliantly.  She has not been able to check her blood pressure.  Patient reports no lightheadedness or dizziness.  No shortness of breath.  Patient has not had any reports of syncope.  No known cardiac disease other than hypertension.  She reports that today she has been more emotional but states that she is otherwise doing well.   Reviewed and updated this visit by provider: Tobacco  Allergies  Meds  Problems  Med Hx  Surg Hx  Fam Hx        ROS:  As documented in the history above, all other relevant system complaints were negative.    Video Visit Objective Findings   Examination conducted with the use of video cameras/computer monitors. Vital signs and other aspects of physical exam are limited due to the nature of this encounter.   Constitutional:  No apparent acute distress noted during the video interaction; Alert and oriented with normal mentation and verbally interactive. Mood:  Appears appropriate to situation.         [1]  Outpatient Encounter Medications as of 01/09/2025:    amLODIPine besylate (NORVASC) 5 mg tablet, Take one tablet (5 mg dose) by mouth daily.   omeprazole (PRILOSEC) 20 mg capsule, Take one capsule (20 mg dose) by mouth daily.

## 2025-01-09 NOTE — ED Notes (Signed)
 Patient transported to CT

## 2025-01-09 NOTE — Discharge Instructions (Signed)
 Thank you for letting us  take care of you today.  You came in today for evaluation of your headache, vision changes as well as chest pain.  We did blood work that was reassuring.  We do not think you are having a heart attack currently.  We also did a CT scan of your head that thankfully did not show any acute abnormalities.  We did speak with the eye doctors and they would like to see you in the clinic tomorrow.  We have attached their information they should call soon as possible to schedule an appointment.  They would ideally like to see you tomorrow.  Please come back to the emergency department if you have frank vision loss, uncontrolled headaches, worsening chest pain, shortness of breath.

## 2025-01-09 NOTE — ED Provider Triage Note (Signed)
 Emergency Medicine Provider Triage Evaluation Note  Terri Walker , a 44 y.o. female  was evaluated in triage.  Pt complains of headache, right-eye vision loss, and chest pain. Headache began last night sometime in the middle of the night. Unilateral vision loss and chest pain began this morning.  She describes the chest pain as a tightness throughout her mid chest extending into the left chest.  Right side vision loss is described as white static.  She was last seen by her eye doctor last January.  She was recently diagnosed with hypertension and just began hypertension meds last Thursday.  She has not missed this medications.  Denies LOC, unilateral weakness, falls, or head injury.  Boyfriend accompanying the patient denies any confusion, slurred speech, facial droop.  Review of Systems  Positive: Unilateral vision loss, chest pain, headache Negative: Head injury, unilateral weakness, LOC  Physical Exam  BP (!) 182/105   Pulse 80   Temp 98.2 F (36.8 C)   Resp 18   LMP 11/26/2020   SpO2 100%  Gen:   Awake, no distress   Resp:  Normal effort  MSK:   Moves extremities without difficulty  Other:  PERRL. Patient reports pain behind the right eye at extremes of cardinal vision test.  No nystagmus noted.  Right eye correction with cover-uncover test.  Equal strength and sensory of bilateral upper and lower extremities.  No facial asymmetry.  Medical Decision Making  Medically screening exam initiated at 3:10 PM.  Appropriate orders placed.  Terri Walker was informed that the remainder of the evaluation will be completed by another provider, this initial triage assessment does not replace that evaluation, and the importance of remaining in the ED until their evaluation is complete.   Rosina Almarie LABOR, PA-C 01/09/25 1513

## 2025-01-09 NOTE — ED Notes (Signed)
 Patient up to the bathroom.
